# Patient Record
Sex: Male | Born: 1965 | Race: White | Hispanic: No | Marital: Single | Smoking: Current every day smoker
Health system: Southern US, Community
[De-identification: ages and names within clinical notes are randomized; demographics above are authoritative.]

## PROBLEM LIST (undated history)

## (undated) DIAGNOSIS — E119 Type 2 diabetes mellitus without complications: Secondary | ICD-10-CM

## (undated) DIAGNOSIS — J181 Lobar pneumonia, unspecified organism: Secondary | ICD-10-CM

## (undated) DIAGNOSIS — F1010 Alcohol abuse, uncomplicated: Secondary | ICD-10-CM

## (undated) DIAGNOSIS — I509 Heart failure, unspecified: Secondary | ICD-10-CM

## (undated) DIAGNOSIS — N289 Disorder of kidney and ureter, unspecified: Secondary | ICD-10-CM

## (undated) HISTORY — PX: KNEE SURGERY: SHX244

## (undated) HISTORY — PX: OTHER SURGICAL HISTORY: SHX169

---

## 2011-03-26 ENCOUNTER — Emergency Department (HOSPITAL_COMMUNITY)
Admission: EM | Admit: 2011-03-26 | Discharge: 2011-03-26 | Disposition: A | Discharge status: Rehabilitation Facility | Payer: Self-pay | Attending: Emergency Medicine | Admitting: Emergency Medicine

## 2011-03-26 ENCOUNTER — Emergency Department (HOSPITAL_COMMUNITY): Payer: Self-pay

## 2011-03-26 ENCOUNTER — Other Ambulatory Visit: Payer: Self-pay

## 2011-03-26 ENCOUNTER — Encounter: Payer: Self-pay | Admitting: Nurse Practitioner

## 2011-03-26 DIAGNOSIS — F32A Depression, unspecified: Secondary | ICD-10-CM

## 2011-03-26 DIAGNOSIS — R109 Unspecified abdominal pain: Secondary | ICD-10-CM | POA: Insufficient documentation

## 2011-03-26 DIAGNOSIS — F17200 Nicotine dependence, unspecified, uncomplicated: Secondary | ICD-10-CM | POA: Insufficient documentation

## 2011-03-26 DIAGNOSIS — R079 Chest pain, unspecified: Secondary | ICD-10-CM | POA: Insufficient documentation

## 2011-03-26 DIAGNOSIS — F329 Major depressive disorder, single episode, unspecified: Secondary | ICD-10-CM

## 2011-03-26 DIAGNOSIS — R05 Cough: Secondary | ICD-10-CM

## 2011-03-26 DIAGNOSIS — R059 Cough, unspecified: Secondary | ICD-10-CM

## 2011-03-26 DIAGNOSIS — F1020 Alcohol dependence, uncomplicated: Secondary | ICD-10-CM

## 2011-03-26 DIAGNOSIS — R0602 Shortness of breath: Secondary | ICD-10-CM | POA: Insufficient documentation

## 2011-03-26 DIAGNOSIS — F3289 Other specified depressive episodes: Secondary | ICD-10-CM | POA: Insufficient documentation

## 2011-03-26 LAB — RAPID URINE DRUG SCREEN, HOSP PERFORMED
Amphetamines: NOT DETECTED
Barbiturates: NOT DETECTED
Benzodiazepines: NOT DETECTED
Cocaine: NOT DETECTED
Opiates: NOT DETECTED
Tetrahydrocannabinol: NOT DETECTED

## 2011-03-26 LAB — URINE MICROSCOPIC-ADD ON

## 2011-03-26 LAB — BASIC METABOLIC PANEL WITH GFR
CO2: 26 meq/L (ref 19–32)
Chloride: 100 meq/L (ref 96–112)
Creatinine, Ser: 0.85 mg/dL (ref 0.50–1.35)
GFR calc Af Amer: >90 mL/min (ref >90)
Potassium: 4 meq/L (ref 3.5–5.1)
Sodium: 135 meq/L (ref 135–145)

## 2011-03-26 LAB — URINALYSIS, ROUTINE W REFLEX MICROSCOPIC
Bilirubin Urine: NEGATIVE
Glucose, UA: NEGATIVE mg/dL
Hgb urine dipstick: NEGATIVE
Ketones, ur: NEGATIVE mg/dL
Nitrite: NEGATIVE
Protein, ur: NEGATIVE mg/dL
Specific Gravity, Urine: 1.022 (ref 1.005–1.030)
Urobilinogen, UA: 1 mg/dL (ref 0.0–1.0)
pH: 6 (ref 5.0–8.0)

## 2011-03-26 LAB — POCT I-STAT TROPONIN I: Troponin i, poc: 0 ng/mL (ref 0.00–0.08)

## 2011-03-26 LAB — CBC
HCT: 45.8 % (ref 39.0–52.0)
Hemoglobin: 16.1 g/dL (ref 13.0–17.0)
MCH: 32.1 pg (ref 26.0–34.0)
MCHC: 35.2 g/dL (ref 30.0–36.0)
MCV: 91.2 fL (ref 78.0–100.0)
Platelets: 214 K/uL (ref 150–400)
RBC: 5.02 MIL/uL (ref 4.22–5.81)
RDW: 13.6 % (ref 11.5–15.5)
WBC: 8.5 K/uL (ref 4.0–10.5)

## 2011-03-26 LAB — BASIC METABOLIC PANEL
BUN: 12 mg/dL (ref 6–23)
Calcium: 9.5 mg/dL (ref 8.4–10.5)
GFR calc non Af Amer: >90 mL/min (ref >90)
Glucose, Bld: 107 mg/dL — ABNORMAL HIGH (ref 70–99)

## 2011-03-26 LAB — ETHANOL: Alcohol, Ethyl (B): <11 mg/dL (ref 0–11)

## 2011-03-26 MED ORDER — GI COCKTAIL ~~LOC~~
30.0000 mL | Freq: Once | ORAL | Status: AC
  Administered 2011-03-26: 30 mL via ORAL
  Filled 2011-03-26: qty 30

## 2011-03-26 NOTE — ED Notes (Signed)
Pt reports he needs to go to [facility] for detox fro ETOH, sent here for medical clearance. Reports last drink last night, daily drinker about a 12 pack of beer. Denies other drugs or substances.

## 2011-03-26 NOTE — BH Assessment (Signed)
Assessment Note   Alex Martinez is an 45 y.o. male.  Patient presented to emergency department after self referring to [facility] for help with alcohol treatment for medical clearance. Patient reports long history of depression AEB feelings of helplessness/helplessness, sometimes feeling crying, deterioration of ADL's and loss of appetite with unknown weight loss.   Patient reports depression has always been present resulting from being adopted and not feeling wanted with worsening symptoms over the past 4 years after his mother and grandmother passed away for which he was the primary care giver for. Additionally patient reports anxiety with shortness of breath and irritability feeling trapped at times. Patient has been using alcohol to cope and does not understand how his life has become so unmanageable. Patient continues to suffer from grief and loss from loosing her mother and grandmother and has had legal charges as a result of alcohol use. He is calm and cooperative wanting help to return his life back on track.  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 No pertinent past medical history. Axis IV: economic problems, housing problems, occupational problems, other psychosocial or environmental problems and problems related to legal system/crime Axis V: 41-50 serious symptoms  Past Medical History: History reviewed. No pertinent past medical history.  History reviewed. No pertinent past surgical history.  Family History:  Family History  Problem Relation Age of Onset  . Adopted: Yes  . Cancer Father     Social History:  reports that he has been smoking.  He does not have any smokeless tobacco history on file. He reports that he drinks about 33.6 ounces of alcohol per week. He reports that he does not use illicit drugs.  Additional Social History:  Alcohol / Drug Use Pain Medications: no Prescriptions: no Over the Counter:  no History of alcohol / drug use?: Yes Longest period of sobriety (when/how long):  (years) Negative Consequences of Use:  (Yes- criminal charge) Withdrawal Symptoms: Other (Comment) (Anxiety) Substance #1 Name of Substance 1:  (Alcohol) 1 - Age of First Use: early 51s 1 - Amount (size/oz): unknown (6-12 beers/daily up to a case when he can afford it) 1 - Frequency: daily 1 - Duration: 2 months 1 - Last Use / Amount: 03/25/11 8-9PM Allergies: No Known Allergies  Home Medications:  Medications Prior to Admission  Medication Dose Route Frequency Provider Last Rate Last Dose  . gi cocktail  30 mL Oral Once Saddie Benders. Ghim, MD   30 mL at 03/26/11 1118   No current outpatient prescriptions on file as of 03/26/2011.    OB/GYN Status:  No LMP for male patient.  General Assessment Data Location of Assessment: [facility] ED Living Arrangements: Alone Can pt return to current living arrangement?: Yes Admission Status: Voluntary Is patient capable of signing voluntary admission?: Yes Transfer from: [facility] Referral Source: Self/Family/Friend  Education Status Is patient currently in school?: No Highest grade of school patient has completed: unknown  Risk to self Suicidal Ideation: No Suicidal Intent: No Is patient at risk for suicide?: No Suicidal Plan?: No Access to Means: No What has been your use of drugs/alcohol within the last 12 months?:  ( 6-12 beers daily up to a case ) Previous Attempts/Gestures: No Other Self Harm Risks: No Triggers for Past Attempts:  (None patient is not suicidal) Intentional Self Injurious Behavior: None Family Suicide History: Unknown (Patient was adopted ) Recent stressful life event(s): Loss (Comment) Persecutory voices/beliefs?: No Depression: Yes Depression  Symptoms: Insomnia;Guilt;Feeling worthless/self pity;Feeling angry/irritable Substance abuse history and/or treatment for substance abuse?: Yes Suicide prevention information given to  non-admitted patients: Not applicable  Risk to Others Homicidal Ideation: No Thoughts of Harm to Others: No Current Homicidal Intent: No Current Homicidal Plan: No Access to Homicidal Means: No Identified Victim:  (Patient is not homicidal) History of harm to others?: No Assessment of Violence: None Noted Violent Behavior Description: None Does patient have access to weapons?: No Criminal Charges Pending?: Yes Describe Pending Criminal Charges:  Irven Shelling - 2 [employer] and larceny felony) Does patient have a court date: Yes Court Date: 04/23/11  Psychosis Hallucinations: None noted Delusions: None noted  Mental Status Report Appear/Hygiene: Body odor;Disheveled;Poor hygiene Eye Contact: Good Motor Activity: Unremarkable Speech: Logical/coherent Level of Consciousness: Alert Mood: Depressed Affect: Anxious Anxiety Level: Severe Thought Processes: Coherent Judgement: Unimpaired Orientation: Person;Place;Time;Situation Obsessive Compulsive Thoughts/Behaviors: None  Cognitive Functioning Concentration: Normal Memory: Recent Intact;Remote Intact IQ: Average Insight: Good Impulse Control: Good Appetite: Poor Weight Loss:  (Yes- unknown amount) Weight Gain:  (None) Sleep: Decreased Total Hours of Sleep:  (3hours/night) Vegetative Symptoms: None  Prior Inpatient Therapy Prior Inpatient Therapy: Yes ([facility] in [place]) Prior Therapy Dates:  (2725D/6644 or 1997) Prior Therapy Facilty/Provider(s): None ([facility] [place] [facility]) Reason for Treatment:  (Alcohol Dependence)  Prior Outpatient Therapy Prior Outpatient Therapy: Yes Reason for Treatment:  (As a child)            Values / Beliefs Cultural Requests During Hospitalization: None Spiritual Requests During Hospitalization: None        Additional Information 1:1 In Past 12 Months?: No CIRT Risk: No Elopement Risk: No Does patient have medical clearance?: Yes     Disposition:   Disposition Disposition of Patient: Referred to Patient referred to: [facility]  On Site Evaluation by:   Reviewed with Physician:    Assessed & entered by Shari Heritage. Chrissie Noa MS, LPCA  Bayard Hugger Mike Gip 03/26/2011 12:10 PM

## 2011-03-26 NOTE — ED Provider Notes (Signed)
Note for EKG documentation   Date: 03/26/2011  Rate: 78  Rhythm: normal sinus rhythm  QRS Axis: normal  Intervals: normal  ST/T Wave abnormalities: normal  Conduction Disutrbances:none  Narrative Interpretation: sinus arrhythymia  Old EKG Reviewed: none available     Richarda Blade, MD 03/26/11 [phone]

## 2011-03-26 NOTE — ED Notes (Signed)
Pt transported to and from radiology in wheelchair with tech and tolerated well.

## 2011-03-26 NOTE — ED Notes (Signed)
[facility] team at bedside for assessment.

## 2011-03-26 NOTE — ED Provider Notes (Signed)
History     CSN: [id number]  Arrival date & time 03/26/11  1026   First MD Initiated Contact with Patient 03/26/11 1055      Chief Complaint  Patient presents with  . Medical Clearance    (Consider location/radiation/quality/duration/timing/severity/associated sxs/prior treatment) HPI Comments: Patient reports that he spoke to a nurse at [facility] which is a detox facility this morning. He was told to come here to the emergency department to speak to a case manager. Reports that he drinks 6-12 beers a day when he can afford it. He reports that he has been depressed reports no close family members, no close friends. He has been living with a family friend. The patient smokes 2 packs per day but denies any other drugs. He reports he is healthy otherwise and takes no other medications. He does report that he has had some intermittent chest tightness in his lower chest and upper epigastric region as well as his left upper flank region. This is associated with occasional shortness of breath and coughing. Is not related to eating. It is not today with nausea, vomiting, sweats. He reports no obvious weight loss, but he has had night sweats in the last several weeks. He voices some concern about this do to his biological father supposedly passing away due to lung cancer at a young age. Patient endorses depression although no active plans of suicide.  He has no PCP.    The history is provided by the patient.    History reviewed. No pertinent past medical history.  History reviewed. No pertinent past surgical history.  Family History  Problem Relation Age of Onset  . Adopted: Yes  . Cancer Father     History  Substance Use Topics  . Smoking status: Current Everyday Smoker -- 2.0 packs/day  . Smokeless tobacco: Not on file  . Alcohol Use: 33.6 oz/week    56 Cans of beer per week      Review of Systems  Respiratory: Positive for cough.   Cardiovascular: Positive for chest pain.    Gastrointestinal: Negative for nausea, vomiting, abdominal pain and diarrhea.  Musculoskeletal: Negative for back pain.  Skin: Negative for color change, rash and wound.  Neurological: Negative for headaches.  Psychiatric/Behavioral: Negative for suicidal ideas.  All other systems reviewed and are negative.    Allergies  Review of patient's allergies indicates no known allergies.  Home Medications  No current outpatient prescriptions on file.  BP 150/95  Pulse 89  Temp(Src) 97.8 F (36.6 C) (Oral)  Resp 18  Ht 5' 10" (1.778 m)  Wt 212 lb (96.163 kg)  BMI 30.42 kg/m2  SpO2 94%  Physical Exam  Nursing note and vitals reviewed. Constitutional: He appears well-developed and well-nourished.  HENT:  Head: Normocephalic and atraumatic.  Eyes: Pupils are equal, round, and reactive to light. No scleral icterus.  Neck: Normal range of motion. Neck supple.  Cardiovascular: Normal rate and regular rhythm.   Pulmonary/Chest: Effort normal. No respiratory distress. He has no wheezes.  Abdominal: Soft. There is no tenderness. There is no rebound.  Skin: Skin is warm.  Psychiatric: His speech is normal. He exhibits a depressed mood. He expresses no suicidal ideation. He expresses no suicidal plans and no homicidal plans.    ED Course  Procedures (including critical care time)  Labs Reviewed  URINALYSIS, ROUTINE W REFLEX MICROSCOPIC - Abnormal; Notable for the following:    Leukocytes, UA SMALL (*)    All other components within normal limits  BASIC METABOLIC PANEL - Abnormal; Notable for the following:    Glucose, Bld 107 (*)    All other components within normal limits  URINE MICROSCOPIC-ADD ON - Abnormal; Notable for the following:    Casts WBC CAST (*) GRANULAR CAST   All other components within normal limits  URINE RAPID DRUG SCREEN (HOSP PERFORMED)  CBC  ETHANOL  POCT I-STAT TROPONIN I  I-STAT TROPONIN I   Dg Chest 2 View  03/26/2011  *RADIOLOGY REPORT*  Clinical  Data: Cough and chest pain  CHEST - 2 VIEW  Comparison: None.  Findings: Normal heart size and normal vascularity.  Lungs are clear without infiltrate, effusion, or mass lesion.  IMPRESSION: Negative  Original Report Authenticated By: Truett Perna, M.D.     1. Alcoholism   2. Depression   3. Cough       MDM    I doubt his CP is cardiac.  Not exertional, not associated with diaphoresis, SOB, although he does smoke so warrants ECG and troponin.  Will get other screening labs.  Discuss with [facility] if cardiac work up pans out to be negative.       1:19 PM  Labs are all ok.  CXR is negative per radiologist which I reviewed.  ECG shows no ischemic changes.  Raquel Sarna with [facility] has seen pt and he is accepted at [facility].  Will discharge to [facility].       Saddie Benders. Otisha Spickler, MD 03/26/11 1319

## 2011-11-18 ENCOUNTER — Encounter (HOSPITAL_COMMUNITY): Payer: Self-pay | Admitting: Emergency Medicine

## 2011-11-18 ENCOUNTER — Emergency Department (HOSPITAL_COMMUNITY)
Admission: EM | Admit: 2011-11-18 | Discharge: 2011-11-19 | Disposition: A | Discharge status: Home / Self Care | Payer: Self-pay | Attending: Emergency Medicine | Admitting: Emergency Medicine

## 2011-11-18 DIAGNOSIS — F17200 Nicotine dependence, unspecified, uncomplicated: Secondary | ICD-10-CM | POA: Insufficient documentation

## 2011-11-18 DIAGNOSIS — F1010 Alcohol abuse, uncomplicated: Secondary | ICD-10-CM | POA: Insufficient documentation

## 2011-11-18 LAB — URINALYSIS, ROUTINE W REFLEX MICROSCOPIC
Bilirubin Urine: NEGATIVE
Ketones, ur: NEGATIVE mg/dL
Nitrite: NEGATIVE
Protein, ur: NEGATIVE mg/dL
Specific Gravity, Urine: 1.019 (ref 1.005–1.030)
Urobilinogen, UA: 0.2 mg/dL (ref 0.0–1.0)

## 2011-11-18 LAB — COMPREHENSIVE METABOLIC PANEL
ALT: 59 U/L — ABNORMAL HIGH (ref 0–53)
AST: 86 U/L — ABNORMAL HIGH (ref 0–37)
Alkaline Phosphatase: 89 U/L (ref 39–117)
GFR calc Af Amer: >90 mL/min (ref >90)
Glucose, Bld: 108 mg/dL — ABNORMAL HIGH (ref 70–99)
Potassium: 3.5 mEq/L (ref 3.5–5.1)
Sodium: 134 mEq/L — ABNORMAL LOW (ref 135–145)
Total Protein: 6.6 g/dL (ref 6.0–8.3)

## 2011-11-18 LAB — RAPID URINE DRUG SCREEN, HOSP PERFORMED
Amphetamines: NOT DETECTED
Barbiturates: NOT DETECTED
Benzodiazepines: NOT DETECTED
Cocaine: NOT DETECTED
Tetrahydrocannabinol: NOT DETECTED

## 2011-11-18 LAB — CBC
Hemoglobin: 14.5 g/dL (ref 13.0–17.0)
MCHC: 35.6 g/dL (ref 30.0–36.0)
Platelets: 203 10*3/uL (ref 150–400)
RBC: 4.41 MIL/uL (ref 4.22–5.81)

## 2011-11-18 MED ORDER — ALUM & MAG HYDROXIDE-SIMETH 200-200-20 MG/5ML PO SUSP: 30.0000 mL | ORAL | Status: DC | PRN

## 2011-11-18 MED ORDER — ONDANSETRON HCL 4 MG PO TABS: 4.0000 mg | ORAL_TABLET | Freq: Three times a day (TID) | ORAL | Status: DC | PRN

## 2011-11-18 MED ORDER — NICOTINE 21 MG/24HR TD PT24
21.0000 mg | MEDICATED_PATCH | Freq: Once | TRANSDERMAL | Status: DC
  Administered 2011-11-18: 21 mg via TRANSDERMAL
  Filled 2011-11-18: qty 1

## 2011-11-18 MED ORDER — NICOTINE 21 MG/24HR TD PT24: 21.0000 mg | MEDICATED_PATCH | Freq: Every day | TRANSDERMAL | Status: DC

## 2011-11-18 MED ORDER — LORAZEPAM 1 MG PO TABS: 1.0000 mg | ORAL_TABLET | Freq: Three times a day (TID) | ORAL | Status: DC | PRN

## 2011-11-18 MED ORDER — LORAZEPAM 1 MG PO TABS
1.0000 mg | ORAL_TABLET | Freq: Once | ORAL | Status: AC
Start: 2011-11-18 — End: 2011-11-18
  Administered 2011-11-18: 1 mg via ORAL
  Filled 2011-11-18: qty 1

## 2011-11-18 MED ORDER — ACETAMINOPHEN 325 MG PO TABS: 650.0000 mg | ORAL_TABLET | ORAL | Status: DC | PRN

## 2011-11-18 MED ORDER — IBUPROFEN 600 MG PO TABS
600.0000 mg | ORAL_TABLET | Freq: Three times a day (TID) | ORAL | Status: DC | PRN
  Administered 2011-11-18: 600 mg via ORAL
  Filled 2011-11-18: qty 1

## 2011-11-18 MED ORDER — ZOLPIDEM TARTRATE 5 MG PO TABS
5.0000 mg | ORAL_TABLET | Freq: Every evening | ORAL | Status: DC | PRN
  Administered 2011-11-18: 5 mg via ORAL
  Filled 2011-11-18: qty 1

## 2011-11-18 NOTE — ED Notes (Signed)
Patient changed into blue scrubs with no clothing on underneath. Security called to wand and search pt belongings.

## 2011-11-18 NOTE — ED Notes (Signed)
Pt states he wants detox from alcohol.  Says he sometimes hears voices after his prayers.  Denies SI/HI.  Last alcohol intake was this AM.

## 2011-11-18 NOTE — ED Provider Notes (Signed)
History     CSN: [id number]  Arrival date & time 11/18/11  1708   First MD Initiated Contact with Patient 11/18/11 1752      Chief Complaint  Patient presents with  . Medical Clearance    wants detox off ETOH. Denies SI/HI    (Consider location/radiation/quality/duration/timing/severity/associated sxs/prior treatment) The history is provided by the patient.    46 y.o.male INAD presenting for detox from alcohol. Patient states that he drinks 440 ounce bottles of beer daily for the past year. He did not drink before that time. Patient does have a history of seizure disorder brought on by stroking lites. He states that he has never had a seizure from alcohol withdrawal. He is having monetary issues with his family at this time he denies any suicidal homicidal ideations hallucinations or acute agitation   History reviewed. No pertinent past medical history.  Past Surgical History  Procedure Date  . Knee surgery   . Arm surgery     Family History  Problem Relation Age of Onset  . Adopted: Yes  . Cancer Father     History  Substance Use Topics  . Smoking status: Current Everyday Smoker -- 2.0 packs/day  . Smokeless tobacco: Not on file  . Alcohol Use: 33.6 oz/week    56 Cans of beer per week     daily      Review of Systems  Psychiatric/Behavioral: Negative for suicidal ideas, hallucinations and self-injury.       Alcohol abuse  All other systems reviewed and are negative.    Allergies  Review of patient's allergies indicates no known allergies.  Home Medications  No current outpatient prescriptions on file.  BP 154/102  Pulse 117  Temp 98.7 F (37.1 C) (Oral)  Resp 18  SpO2 97%  Physical Exam  Vitals reviewed. Constitutional: He is oriented to person, place, and time. He appears well-developed and well-nourished. No distress.  HENT:  Head: Normocephalic and atraumatic.       No tongue fasciculations.  Eyes: Conjunctivae and EOM are normal. Pupils  are equal, round, and reactive to light.  Neck: Normal range of motion.  Cardiovascular: Normal rate, normal heart sounds and intact distal pulses.  Exam reveals no gallop and no friction rub.   No murmur heard.      Patient is not tachycardic at time of exam.  Pulmonary/Chest: Effort normal and breath sounds normal. No respiratory distress. He has no wheezes. He has no rales. He exhibits no tenderness.  Abdominal: Bowel sounds are normal.  Musculoskeletal: Normal range of motion.  Neurological: He is alert and oriented to person, place, and time.  Skin: Skin is warm and dry.  Psychiatric: He has a normal mood and affect. His behavior is normal. Judgment and thought content normal.    ED Course  Procedures (including critical care time)  Labs Reviewed  COMPREHENSIVE METABOLIC PANEL - Abnormal; Notable for the following:    Sodium 134 (*)     Chloride 95 (*)     Glucose, Bld 108 (*)     Albumin 3.3 (*)     AST 86 (*)  REPEATED TO VERIFY   ALT 59 (*)     All other components within normal limits  CBC  ETHANOL  ACETAMINOPHEN LEVEL  URINE RAPID DRUG SCREEN (HOSP PERFORMED)  URINALYSIS, ROUTINE W REFLEX MICROSCOPIC   No results found.   1. Alcohol abuse       MDM  46 year old male in no acute distress  presenting for alcohol detoxification. Patient shows no sign of onset of delirium tremens. He has no medical issues at this time. Physical exam is unremarkable and blood work shows only a slight elevation in LFTs.  He is medically cleared for psychiatric evaluation for detox from alcohol.         Monico Blitz, PA-C 11/19/11 [phone]
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
## 2011-11-18 NOTE — ED Notes (Signed)
Pt presenting to ed with c/o needing detox pt states he has been drinking everyday x 1 year. Pt states he had his last drink at 14:00pm pt states he drinks 4 40oz beers daily. Pt denies SI/HI at this time

## 2011-11-18 NOTE — ED Notes (Signed)
Patient was wanded and belongings searched by security.

## 2011-11-19 NOTE — ED Notes (Signed)
Pt states he wants to be discharged.

## 2011-11-19 NOTE — ED Provider Notes (Addendum)
  Physical Exam  BP 130/83  Pulse 95  Temp 97.5 F (36.4 C) (Oral)  Resp 20  SpO2 98%  Physical Exam  ED Course  Procedures  MDM Patient is requesting detox off alcohol. He states he drinks about 3 to 4 40s a day. He states that she's been drinking because because Dr. Jacelyn Grip in [place] who is the executor of a trust has been stealing money from him. He states it is not able to get him off the trust because Dr. Arelia Sneddon will not sign the paperwork. Patient has been reviewed by behavioral health. He will reportedly have to be evaluated again on a bed is available.      Jasper Riling. Alvino Chapel, MD 11/19/11 [phone]  Patient no longer wants to stay for treatment. He is not suicidal  Or homicidal be discharged home.  Jasper Riling. Alvino Chapel, MD 11/19/11 1410

## 2011-11-19 NOTE — BHH Counselor (Signed)
Per RN, pt requesting to be d/c with OPT resources as he no longer wished to remain for placement at [facility]. Provided OPT resources for pt.

## 2011-11-20 NOTE — ED Provider Notes (Signed)
Medical screening examination/treatment/procedure(s) were performed by non-physician practitioner and as supervising physician I was immediately available for consultation/collaboration.  Leota Jacobsen, MD 11/20/11 [phone]

## 2012-08-01 ENCOUNTER — Emergency Department (HOSPITAL_COMMUNITY): Admission: EM | Admit: 2012-08-01 | Discharge: 2012-08-01 | Discharge status: Against Medical Advice | Payer: Self-pay

## 2012-08-01 NOTE — ED Notes (Signed)
Pt called to triage; states he has to go to work and cannot stay; pt states he doesn't need to stay tonight and walked out of ER; attempted to talk with patient and ask him what he needed to be seen for and patient walked out saying he didn't need to talk; seen leaving ER with family

## 2012-08-03 ENCOUNTER — Emergency Department (HOSPITAL_COMMUNITY): Payer: Self-pay

## 2012-08-03 ENCOUNTER — Emergency Department (HOSPITAL_COMMUNITY)
Admission: EM | Admit: 2012-08-03 | Discharge: 2012-08-03 | Disposition: A | Discharge status: Home / Self Care | Payer: Self-pay | Attending: Emergency Medicine | Admitting: Emergency Medicine

## 2012-08-03 ENCOUNTER — Encounter (HOSPITAL_COMMUNITY): Payer: Self-pay | Admitting: Emergency Medicine

## 2012-08-03 DIAGNOSIS — K625 Hemorrhage of anus and rectum: Secondary | ICD-10-CM | POA: Insufficient documentation

## 2012-08-03 DIAGNOSIS — R109 Unspecified abdominal pain: Secondary | ICD-10-CM | POA: Insufficient documentation

## 2012-08-03 DIAGNOSIS — F1010 Alcohol abuse, uncomplicated: Secondary | ICD-10-CM | POA: Insufficient documentation

## 2012-08-03 DIAGNOSIS — K921 Melena: Secondary | ICD-10-CM | POA: Insufficient documentation

## 2012-08-03 DIAGNOSIS — F17200 Nicotine dependence, unspecified, uncomplicated: Secondary | ICD-10-CM | POA: Insufficient documentation

## 2012-08-03 LAB — COMPREHENSIVE METABOLIC PANEL
ALT: 33 U/L (ref 0–53)
AST: 31 U/L (ref 0–37)
Albumin: 3.3 g/dL — ABNORMAL LOW (ref 3.5–5.2)
Alkaline Phosphatase: 74 U/L (ref 39–117)
Chloride: 104 mEq/L (ref 96–112)
Potassium: 3.9 mEq/L (ref 3.5–5.1)
Total Bilirubin: 0.3 mg/dL (ref 0.3–1.2)

## 2012-08-03 LAB — CBC
Platelets: 223 10*3/uL (ref 150–400)
RDW: 13.4 % (ref 11.5–15.5)
WBC: 9 10*3/uL (ref 4.0–10.5)

## 2012-08-03 LAB — PROTIME-INR
INR: 0.84 (ref 0.00–1.49)
Prothrombin Time: 11.5 seconds — ABNORMAL LOW (ref 11.6–15.2)

## 2012-08-03 LAB — TYPE AND SCREEN: Antibody Screen: NEGATIVE

## 2012-08-03 MED ORDER — LORAZEPAM 1 MG PO TABS: 1.0000 mg | ORAL_TABLET | Freq: Four times a day (QID) | ORAL | Status: DC | PRN

## 2012-08-03 MED ORDER — IBUPROFEN 600 MG PO TABS: 600.0000 mg | ORAL_TABLET | Freq: Three times a day (TID) | ORAL | Status: DC | PRN

## 2012-08-03 MED ORDER — LORAZEPAM 1 MG PO TABS: 0.0000 mg | ORAL_TABLET | Freq: Two times a day (BID) | ORAL | Status: DC

## 2012-08-03 MED ORDER — THIAMINE HCL 100 MG/ML IJ SOLN: 100.0000 mg | Freq: Every day | INTRAMUSCULAR | Status: DC

## 2012-08-03 MED ORDER — FOLIC ACID 1 MG PO TABS
1.0000 mg | ORAL_TABLET | Freq: Every day | ORAL | Status: DC
  Administered 2012-08-03: 1 mg via ORAL
  Filled 2012-08-03: qty 1

## 2012-08-03 MED ORDER — ADULT MULTIVITAMIN W/MINERALS CH
1.0000 | ORAL_TABLET | Freq: Every day | ORAL | Status: DC
  Administered 2012-08-03: 1 via ORAL
  Filled 2012-08-03: qty 1

## 2012-08-03 MED ORDER — LORAZEPAM 1 MG PO TABS: 0.0000 mg | ORAL_TABLET | Freq: Four times a day (QID) | ORAL | Status: DC

## 2012-08-03 MED ORDER — VITAMIN B-1 100 MG PO TABS
100.0000 mg | ORAL_TABLET | Freq: Every day | ORAL | Status: DC
  Administered 2012-08-03: 100 mg via ORAL
  Filled 2012-08-03: qty 1

## 2012-08-03 MED ORDER — NICOTINE 21 MG/24HR TD PT24
21.0000 mg | MEDICATED_PATCH | Freq: Every day | TRANSDERMAL | Status: DC
  Administered 2012-08-03: 21 mg via TRANSDERMAL
  Filled 2012-08-03: qty 1

## 2012-08-03 MED ORDER — LORAZEPAM 2 MG/ML IJ SOLN: 1.0000 mg | Freq: Four times a day (QID) | INTRAMUSCULAR | Status: DC | PRN

## 2012-08-03 MED ORDER — ONDANSETRON HCL 4 MG PO TABS: 4.0000 mg | ORAL_TABLET | Freq: Three times a day (TID) | ORAL | Status: DC | PRN

## 2012-08-03 NOTE — ED Notes (Signed)
[id number]> Expected date:<BR> Expected time:<BR> Means of arrival:<BR> Comments:<BR> Hold for room 1

## 2012-08-03 NOTE — ED Notes (Signed)
Pt presents for detox from alcohol.  Admits to drinking 3-4/40 oz beers/day. Denies drug use.  Pt calm & cooperative.

## 2012-08-03 NOTE — ED Provider Notes (Addendum)
History     CSN: [id number]  Arrival date & time 08/03/12  1624   First MD Initiated Contact with Patient 08/03/12 1645      Chief Complaint  Patient presents with  . Medical Clearance  . detox    . Rectal Bleeding    (Consider location/radiation/quality/duration/timing/severity/associated sxs/prior treatment) HPI Comments: Is requesting detox from alcohol. He states he drinks 3-4 40 ounce beers daily. His last drink was yesterday morning. He longest he has gone without a drink is 4 days. To light to quit drinking. Denies any SI or HI. Denies any illicit drug use. Also complains of intermittent rectal bleeding and he's had for the past year. With bowel movements he only has bright red blood on the toilet paper with wiping. He states sometimes his stools are brown sometimes his stools are black. Denies any nausea or vomiting. He said intermittent left-sided abdominal pain since 1989 that comes and goes. Denies any chest pain, shortness of breath, dizziness or lightheadedness.  The history is provided by the patient.    History reviewed. No pertinent past medical history.  Past Surgical History  Procedure Laterality Date  . Knee surgery    . Arm surgery      Family History  Problem Relation Age of Onset  . Adopted: Yes  . Cancer Father     History  Substance Use Topics  . Smoking status: Current Every Day Smoker -- 2.00 packs/day    Types: Cigarettes  . Smokeless tobacco: Not on file  . Alcohol Use: 33.6 oz/week    56 Cans of beer per week     Comment: daily      Review of Systems  Constitutional: Negative for fever, activity change and appetite change.  HENT: Negative for congestion and rhinorrhea.   Respiratory: Negative for cough, chest tightness and shortness of breath.   Cardiovascular: Negative for chest pain.  Gastrointestinal: Positive for abdominal pain, blood in stool and hematochezia. Negative for nausea and vomiting.  Genitourinary: Negative for dysuria  and hematuria.  Musculoskeletal: Negative for back pain.  Skin: Negative for rash.  Neurological: Negative for dizziness, weakness and light-headedness.  Psychiatric/Behavioral: Negative for suicidal ideas.  A complete 10 system review of systems was obtained and all systems are negative except as noted in the HPI and PMH.    Allergies  Review of patient's allergies indicates no known allergies.  Home Medications   Current Outpatient Rx  Name  Route  Sig  Dispense  Refill  . ibuprofen (ADVIL,MOTRIN) 200 MG tablet   Oral   Take 200 mg by mouth every 6 (six) hours as needed for pain.           BP 134/94  Pulse 108  Temp(Src) 98.3 F (36.8 C) (Oral)  Resp 13  SpO2 97%  Physical Exam  Constitutional: He is oriented to person, place, and time. He appears well-developed and well-nourished. No distress.  HENT:  Head: Normocephalic and atraumatic.  Mouth/Throat: Oropharynx is clear and moist. No oropharyngeal exudate.  Eyes: Conjunctivae and EOM are normal. Pupils are equal, round, and reactive to light.  Neck: Normal range of motion. Neck supple.  Cardiovascular: Normal rate, regular rhythm and normal heart sounds.   No murmur heard. Pulmonary/Chest: Effort normal and breath sounds normal. No respiratory distress.  Abdominal: Soft. There is no tenderness. There is no rebound and no guarding.  Genitourinary: Guaiac positive stool.  No external hemorrhoids. Brown stool, no gross blood, trace guaiac positive  Musculoskeletal: Normal  range of motion. He exhibits no edema and no tenderness.  Neurological: He is alert and oriented to person, place, and time. No cranial nerve deficit. He exhibits normal muscle tone. Coordination normal.  Skin: Skin is warm.    ED Course  Procedures (including critical care time)  Labs Reviewed  COMPREHENSIVE METABOLIC PANEL - Abnormal; Notable for the following:    Albumin 3.3 (*)    All other components within normal limits  PROTIME-INR -  Abnormal; Notable for the following:    Prothrombin Time 11.5 (*)    All other components within normal limits  CBC  ETHANOL  TYPE AND SCREEN  ABO/RH   Dg Abd Acute W/chest  08/03/2012  *RADIOLOGY REPORT*  Clinical Data: Left-sided abdominal pain with vomiting, constipation, and diarrhea.  ACUTE ABDOMEN SERIES (ABDOMEN 2 VIEW & CHEST 1 VIEW)  Comparison: Chest x-ray 03/26/2011.  Findings: Normal heart size with clear lung fields.  No bony abnormality.  No free air.  Nonobstructive gas pattern.  Moderate stool burden.  Pelvic phleboliths.  No other abnormal calcifications are identified. There is no free air or bowel wall thickening.  IMPRESSION: No active cardiopulmonary disease.  Nonobstructive bowel gas pattern.   Original Report Authenticated By: Rolla Flatten, M.D.      No diagnosis found.    MDM  Alcohol abuse requesting detox. intermittent rectal bleeding for the better part of one year. Questionable history of melena. No vomiting or hematemesis.  Hemoglobin within normal limits and stable from previous. Patient with no gross blood on rectal exam. Brown stool is trace guaiac positive. He is a hemodynamically stable, mildly tachycardic some of which may be coming from his alcohol withdrawal. His rectal bleeding is an ongoing issue for the better part of a year. It can be further worked up as an outpatient.   He is medically clear for alcohol detox. Alcohol withdrawal orders placed.    Date: 08/03/2012  Rate: 99  Rhythm: normal sinus rhythm  QRS Axis: normal  Intervals: normal  ST/T Wave abnormalities: normal  Conduction Disutrbances:none  Narrative Interpretation:   Old EKG Reviewed: unchanged  ACT team has given patient resources.  He does not meet inpatient criteria.  He denies SI or HI. He would like to go home. Heart rate has improved. No evidence of alcohol withdrawal or active bleeding. BP 145/93  Pulse 89  Temp(Src) 99 F (37.2 C) (Oral)  Resp 18  SpO2  95%    Ezequiel Essex, MD 08/03/12 [place], MD 08/03/12 2302

## 2012-08-03 NOTE — ED Notes (Signed)
Pt presenting to ed with c/o wanting to request detox from alcohol. Pt states he has been drinking 3-4 40oz beers daily sometimes more. Pt states he last drank yesterday morning. Pt denies SI/HI at this time. Pt also with c/o rectal bleeding x 1 year that's intermittently heavy.

## 2012-08-04 NOTE — BH Assessment (Signed)
Assessment Note   Alex Martinez is a 47 y.o. male presenting to [facility] for detox from alcohol.  Pt denies SI/HI/Psych.  Pt consumes 3-4 40's daily, last use was 08/01/12, pt drank 2-40's.  Pt denies any other substance use.  Pt denies any previous detox treatment and is requesting rehab and detox treatment.  Pt.'s stressors: homeless, financial and unemployed for 6 yrs. Pt has no legal issues, denies any issues with seizures/blackouts.  Pt c/o w/d sxs: cold sweats.  Pt.'s alcohol level is <11 and pt was given referrals for rehab services.  This Probation officer discussed disposition with Dr. Wyvonnia Dusky, pt will be d/c'd to pursue rehab.    Axis I: Alcohol Abuse Axis II: Deferred Axis III: History reviewed. No pertinent past medical history. Axis IV: economic problems, housing problems, occupational problems, other psychosocial or environmental problems, problems related to social environment and problems with primary support group Axis V: 51-60 moderate symptoms  Past Medical History: History reviewed. No pertinent past medical history.  Past Surgical History  Procedure Laterality Date  . Knee surgery    . Arm surgery      Family History:  Family History  Problem Relation Age of Onset  . Adopted: Yes  . Cancer Father     Social History:  reports that he has been smoking Cigarettes.  He has been smoking about 2.00 packs per day. He does not have any smokeless tobacco history on file. He reports that he drinks about 33.6 ounces of alcohol per week. He reports that he does not use illicit drugs.  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 Withdrawal Symptoms: Other (Comment) (Cold Sweats )  CIWA: CIWA-Ar BP: 145/93 mmHg Pulse Rate: 89 Nausea and Vomiting: no nausea and no vomiting Tactile Disturbances: none Tremor:  no tremor Auditory Disturbances: not present Paroxysmal Sweats: no sweat visible Visual Disturbances: not present Anxiety: no anxiety, at ease Headache, Fullness in Head: none present Agitation: normal activity Orientation and Clouding of Sensorium: oriented and can do serial additions CIWA-Ar Total: 0 COWS:    Allergies: No Known Allergies  Home Medications:  (Not in a hospital admission)  OB/GYN Status:  No LMP for male patient.  General Assessment Data Location of Assessment: [facility] Living Arrangements: Other (Comment) (Homeless ) Can pt return to current living arrangement?: Yes Admission Status: Voluntary Is patient capable of signing voluntary admission?: Yes Transfer from: [facility] Referral Source: MD  Education Status Is patient currently in school?: No Current Grade: None  Highest grade of school patient has completed: None  Name of school: None  Contact person: None   Risk to self Suicidal Ideation: No Suicidal Intent: No Is patient at risk for suicide?: No Suicidal Plan?: No Access to Means: No What has been your use of drugs/alcohol within the last 12 months?: Abusing: Alcohol  Previous Attempts/Gestures: No How many times?: 0 Other Self Harm Risks: None  Triggers for Past Attempts: None known Intentional Self Injurious Behavior: None Family Suicide History: No 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 , Museum/gallery curator, Employment ) Persecutory voices/beliefs?: No Depression: Yes Depression Symptoms: Loss of interest in usual pleasures Substance abuse history and/or treatment for substance abuse?: Yes Suicide prevention information given to non-admitted patients: Not applicable  Risk to Others Homicidal Ideation: No Thoughts of Harm to Others: No Current Homicidal Intent:  No Current Homicidal Plan: No Access to Homicidal Means: No Identified Victim: None  History of harm to others?: No Assessment of Violence: None  Noted Violent Behavior Description: None  Does patient have access to weapons?: No Criminal Charges Pending?: No Does patient have a court date: No  Psychosis Hallucinations: None noted Delusions: None noted  Mental Status Report Appear/Hygiene: Other (Comment) (Appropriate ) Eye Contact: Good Motor Activity: Unremarkable Speech: Logical/coherent Level of Consciousness: Alert Mood: Sad Affect: Sad Anxiety Level: None Thought Processes: Coherent;Relevant Judgement: Unimpaired Orientation: Person;Place;Time;Situation Obsessive Compulsive Thoughts/Behaviors: None  Cognitive Functioning Concentration: Normal Memory: Recent Intact;Remote Intact IQ: Average Insight: Good Impulse Control: Good Appetite: Good Weight Loss: 0 Weight Gain: 0 Sleep: Decreased Total Hours of Sleep: 4 Vegetative Symptoms: None  ADLScreening [facility]) Patient's cognitive ability adequate to safely complete daily activities?: Yes Patient able to express need for assistance with ADLs?: Yes Independently performs ADLs?: Yes (appropriate for developmental age)  Abuse/Neglect [facility]) Physical Abuse: Denies Verbal Abuse: Denies Sexual Abuse: Denies  Prior Inpatient Therapy Prior Inpatient Therapy: No Prior Therapy Dates: None  Prior Therapy Facilty/Provider(s): None  Reason for Treatment: None   Prior Outpatient Therapy Prior Outpatient Therapy: No Prior Therapy Dates: None  Prior Therapy Facilty/Provider(s): None  Reason for Treatment: None   ADL Screening (condition at time of admission) Patient's cognitive ability adequate to safely complete daily activities?: Yes Patient able to express need for assistance with ADLs?: Yes Independently performs ADLs?: Yes (appropriate for developmental age) Weakness of Legs: None Weakness of Arms/Hands: None  Home Assistive Devices/Equipment Home Assistive Devices/Equipment: None  Therapy Consults (therapy consults require a physician  order) PT Evaluation Needed: No OT Evalulation Needed: No SLP Evaluation Needed: No Abuse/Neglect Assessment (Assessment to be complete while patient is alone) Physical Abuse: Denies Verbal Abuse: Denies Sexual Abuse: Denies Exploitation of patient/patient's resources: Denies Self-Neglect: Denies Values / Beliefs Cultural Requests During Hospitalization: None Spiritual Requests During Hospitalization: None Consults Spiritual Care Consult Needed: No Social Work Consult Needed: No Regulatory affairs officer (For Healthcare) Advance Directive: Patient does not have advance directive;Patient would not like information Pre-existing out of facility DNR order (yellow form or pink MOST form): No Nutrition Screen- [facility] Patient's home diet: Regular Have you recently lost weight without trying?: No Have you been eating poorly because of a decreased appetite?: No Malnutrition Screening Tool Score: 0  Additional Information 1:1 In Past 12 Months?: No CIRT Risk: No Elopement Risk: No Does patient have medical clearance?: Yes     Disposition:  Disposition Initial Assessment Completed for this Encounter: Yes Disposition of Patient: Referred to;Outpatient treatment (Referrals for rehab ) Type of outpatient treatment: Adult Patient referred to: Other (Comment) (Referrals for Rehab )  On Site Evaluation by:   Reviewed with Physician:     Girtha Rm 08/04/2012 12:27 AM

## 2013-07-20 ENCOUNTER — Ambulatory Visit: Payer: Self-pay

## 2013-08-03 ENCOUNTER — Encounter: Payer: Self-pay | Admitting: Internal Medicine

## 2013-08-03 ENCOUNTER — Ambulatory Visit: Payer: No Typology Code available for payment source | Attending: Internal Medicine | Admitting: Internal Medicine

## 2013-08-03 ENCOUNTER — Other Ambulatory Visit: Payer: Self-pay | Admitting: *Deleted

## 2013-08-03 ENCOUNTER — Ambulatory Visit: Payer: No Typology Code available for payment source | Attending: Internal Medicine

## 2013-08-03 ENCOUNTER — Telehealth: Payer: Self-pay | Admitting: *Deleted

## 2013-08-03 VITALS — BP 133/90 | HR 107 | Temp 99.1°F | Resp 16 | Ht 70.75 in | Wt 215.4 lb

## 2013-08-03 DIAGNOSIS — R059 Cough, unspecified: Secondary | ICD-10-CM

## 2013-08-03 DIAGNOSIS — IMO0001 Reserved for inherently not codable concepts without codable children: Secondary | ICD-10-CM

## 2013-08-03 DIAGNOSIS — K029 Dental caries, unspecified: Secondary | ICD-10-CM

## 2013-08-03 DIAGNOSIS — R079 Chest pain, unspecified: Secondary | ICD-10-CM | POA: Insufficient documentation

## 2013-08-03 DIAGNOSIS — R61 Generalized hyperhidrosis: Secondary | ICD-10-CM | POA: Insufficient documentation

## 2013-08-03 DIAGNOSIS — R05 Cough: Secondary | ICD-10-CM

## 2013-08-03 DIAGNOSIS — F17200 Nicotine dependence, unspecified, uncomplicated: Secondary | ICD-10-CM | POA: Insufficient documentation

## 2013-08-03 DIAGNOSIS — R1110 Vomiting, unspecified: Secondary | ICD-10-CM | POA: Insufficient documentation

## 2013-08-03 MED ORDER — AZITHROMYCIN 250 MG PO TABS: ORAL_TABLET | ORAL | Status: DC

## 2013-08-03 NOTE — Progress Notes (Signed)
Pt here to establish care. States he has had a toothache for the last 3 weeks and would like referral for dental care  Referred to social worker for PHQ-9 score of 20 C/O of night sweats, cough and chest pains for 1 year.

## 2013-08-03 NOTE — Telephone Encounter (Signed)
Patient called to verify appointment for 3:30 PM today. Informed patient that he does have an appointment today ([id number]) at 3:30 PM for eligibility. Alverda Skeans, RN

## 2013-08-03 NOTE — Patient Instructions (Signed)
Smoking Cessation Quitting smoking is important to your health and has many advantages. However, it is not always easy to quit since nicotine is a very addictive drug. Often times, people try 3 times or more before being able to quit. This document explains the best ways for you to prepare to quit smoking. Quitting takes hard work and a lot of effort, but you can do it. ADVANTAGES OF QUITTING SMOKING  You will live longer, feel better, and live better.  Your body will feel the impact of quitting smoking almost immediately.  Within 20 minutes, blood pressure decreases. Your pulse returns to its normal level.  After 8 hours, carbon monoxide levels in the blood return to normal. Your oxygen level increases.  After 24 hours, the chance of having a heart attack starts to decrease. Your breath, hair, and body stop smelling like smoke.  After 48 hours, damaged nerve endings begin to recover. Your sense of taste and smell improve.  After 72 hours, the body is virtually free of nicotine. Your bronchial tubes relax and breathing becomes easier.  After 2 to 12 weeks, lungs can hold more air. Exercise becomes easier and circulation improves.  The risk of having a heart attack, stroke, cancer, or lung disease is greatly reduced.  After 1 year, the risk of coronary heart disease is cut in half.  After 5 years, the risk of stroke falls to the same as a nonsmoker.  After 10 years, the risk of lung cancer is cut in half and the risk of other cancers decreases significantly.  After 15 years, the risk of coronary heart disease drops, usually to the level of a nonsmoker.  If you are pregnant, quitting smoking will improve your chances of having a healthy baby.  The people you live with, especially any children, will be healthier.  You will have extra money to spend on things other than cigarettes. QUESTIONS TO THINK ABOUT BEFORE ATTEMPTING TO QUIT You may want to talk about your answers with your  caregiver.  Why do you want to quit?  If you tried to quit in the past, what helped and what did not?  What will be the most difficult situations for you after you quit? How will you plan to handle them?  Who can help you through the tough times? Your family? Friends? A caregiver?  What pleasures do you get from smoking? What ways can you still get pleasure if you quit? Here are some questions to ask your caregiver:  How can you help me to be successful at quitting?  What medicine do you think would be best for me and how should I take it?  What should I do if I need more help?  What is smoking withdrawal like? How can I get information on withdrawal? GET READY  Set a quit date.  Change your environment by getting rid of all cigarettes, ashtrays, matches, and lighters in your home, car, or work. Do not let people smoke in your home.  Review your past attempts to quit. Think about what worked and what did not. GET SUPPORT AND ENCOURAGEMENT You have a better chance of being successful if you have help. You can get support in many ways.  Tell your family, friends, and co-workers that you are going to quit and need their support. Ask them not to smoke around you.  Get individual, group, or telephone counseling and support. Programs are available at local hospitals and health centers. Call your local health department for   information about programs in your area.  Spiritual beliefs and practices may help some smokers quit.  Download a "quit meter" on your computer to keep track of quit statistics, such as how long you have gone without smoking, cigarettes not smoked, and money saved.  Get a self-help book about quitting smoking and staying off of tobacco. Owen yourself from urges to smoke. Talk to someone, go for a walk, or occupy your time with a task.  Change your normal routine. Take a different route to work. Drink tea instead of coffee.  Eat breakfast in a different place.  Reduce your stress. Take a hot bath, exercise, or read a book.  Plan something enjoyable to do every day. Reward yourself for not smoking.  Explore interactive web-based programs that specialize in helping you quit. GET MEDICINE AND USE IT CORRECTLY Medicines can help you stop smoking and decrease the urge to smoke. Combining medicine with the above behavioral methods and support can greatly increase your chances of successfully quitting smoking.  Nicotine replacement therapy helps deliver nicotine to your body without the negative effects and risks of smoking. Nicotine replacement therapy includes nicotine gum, lozenges, inhalers, nasal sprays, and skin patches. Some may be available over-the-counter and others require a prescription.  Antidepressant medicine helps people abstain from smoking, but how this works is unknown. This medicine is available by prescription.  Nicotinic receptor partial agonist medicine simulates the effect of nicotine in your brain. This medicine is available by prescription. Ask your caregiver for advice about which medicines to use and how to use them based on your health history. Your caregiver will tell you what side effects to look out for if you choose to be on a medicine or therapy. Carefully read the information on the package. Do not use any other product containing nicotine while using a nicotine replacement product.  RELAPSE OR DIFFICULT SITUATIONS Most relapses occur within the first 3 months after quitting. Do not be discouraged if you start smoking again. Remember, most people try several times before finally quitting. You may have symptoms of withdrawal because your body is used to nicotine. You may crave cigarettes, be irritable, feel very hungry, cough often, get headaches, or have difficulty concentrating. The withdrawal symptoms are only temporary. They are strongest when you first quit, but they will go away within  10 14 days. To reduce the chances of relapse, try to:  Avoid drinking alcohol. Drinking lowers your chances of successfully quitting.  Reduce the amount of caffeine you consume. Once you quit smoking, the amount of caffeine in your body increases and can give you symptoms, such as a rapid heartbeat, sweating, and anxiety.  Avoid smokers because they can make you want to smoke.  Do not let weight gain distract you. Many smokers will gain weight when they quit, usually less than 10 pounds. Eat a healthy diet and stay active. You can always lose the weight gained after you quit.  Find ways to improve your mood other than smoking. FOR MORE INFORMATION  www.smokefree.gov  Document Released: 03/16/2001 Document Revised: 09/21/2011 Document Reviewed: 07/01/2011 [facility] Patient Information 2014 [place], [place].

## 2013-08-04 LAB — CBC WITH DIFFERENTIAL/PLATELET
BASOS PCT: 1 % (ref 0–1)
Basophils Absolute: 0.1 10*3/uL (ref 0.0–0.1)
Eosinophils Absolute: 0.2 10*3/uL (ref 0.0–0.7)
Eosinophils Relative: 3 % (ref 0–5)
HCT: 41.6 % (ref 39.0–52.0)
HEMOGLOBIN: 14.6 g/dL (ref 13.0–17.0)
Lymphocytes Relative: 36 % (ref 12–46)
Lymphs Abs: 2.8 10*3/uL (ref 0.7–4.0)
MCH: 32.8 pg (ref 26.0–34.0)
MCHC: 35.1 g/dL (ref 30.0–36.0)
MCV: 93.5 fL (ref 78.0–100.0)
MONOS PCT: 5 % (ref 3–12)
Monocytes Absolute: 0.4 10*3/uL (ref 0.1–1.0)
NEUTROS ABS: 4.3 10*3/uL (ref 1.7–7.7)
NEUTROS PCT: 55 % (ref 43–77)
PLATELETS: 377 10*3/uL (ref 150–400)
RBC: 4.45 MIL/uL (ref 4.22–5.81)
RDW: 14.6 % (ref 11.5–15.5)
WBC: 7.9 10*3/uL (ref 4.0–10.5)

## 2013-08-06 NOTE — Progress Notes (Signed)
Patient ID: Alex Martinez, male   DOB: 09-14-1965, 48 y.o.   MRN: [id number]  VOZ:[id number]  [id number]  DOB - 17-Mar-1966  CC:  Chief Complaint  Patient presents with  . Establish Care       HPI: Alex Martinez is a 48 y.o. male with no past medical history, here today to establish medical care. He presents today with complaints of cough with clear sputum production, night sweats, chills, and chest pain for the past year.  Patient reports that he is a smoker but is attempting to quit. Patient reports that his coughing causes him to vomit. Patient also presents with dental pain on bilateral sides of his mouth and wants a dental referral.   Patient has No headache, No chest pain, No abdominal pain - No Nausea, No new weakness tingling or numbness, No Cough - SOB.  No Known Allergies History reviewed. No pertinent past medical history. Current Outpatient Prescriptions on File Prior to Visit  Medication Sig Dispense Refill  . ibuprofen (ADVIL,MOTRIN) 200 MG tablet Take 200 mg by mouth every 6 (six) hours as needed for pain.       No current facility-administered medications on file prior to visit.   Family History  Problem Relation Age of Onset  . Adopted: Yes  . Cancer Father    History   Social History  . Marital Status: Single    Spouse Name: N/A    Number of Children: N/A  . Years of Education: N/A   Occupational History  . Not on file.   Social History Main Topics  . Smoking status: Current Every Day Smoker -- 2.00 packs/day    Types: Cigarettes  . Smokeless tobacco: Not on file  . Alcohol Use: 33.6 oz/week    56 Cans of beer per week     Comment: daily  . Drug Use: No  . Sexual Activity: Not on file   Other Topics Concern  . Not on file   Social History Narrative  . No narrative on file    Review of Systems: Constitutional: Negative for diaphoresis, activity change, appetite change and fatigue. Positive for fever and chills HENT: Negative for ear  pain, nosebleeds, congestion, facial swelling, rhinorrhea, neck pain, neck stiffness and ear discharge. Positive for dental pain Eyes: Negative for pain, discharge, redness, itching and visual disturbance. Respiratory: Negative for choking, chest tightness, shortness of breath, wheezing and stridor. Positive for cough Cardiovascular: Negative for palpitations and leg swelling. Positive for chest pain Gastrointestinal: Negative for abdominal distention. Positive for rectal bleeding due to NSAID use in the past Genitourinary: Negative for dysuria, urgency, frequency, hematuria, flank pain, decreased urine volume, difficulty urinating and dyspareunia.  Musculoskeletal: Negative for back pain, joint swelling, arthralgia and gait problem. Neurological: Negative for dizziness, tremors, seizures, syncope, facial asymmetry, speech difficulty, weakness, light-headedness, numbness and headaches.  Hematological: Negative for adenopathy. Does not bruise/bleed easily. Psychiatric/Behavioral: Negative for hallucinations, behavioral problems, confusion, dysphoric mood, decreased concentration and agitation.     Objective:   Filed Vitals:   08/03/13 1712  BP: 133/90  Pulse: 107  Temp: 99.1 F (37.3 C)  Resp: 16    Physical Exam: Constitutional: Patient appears well-developed and well-nourished. De-shelved appearance. HENT: Normocephalic, atraumatic, External right and left ear normal. Oropharynx is clear and moist.  Eyes: Conjunctivae and EOM are normal. PERRLA, no scleral icterus. Neck: Normal ROM. Neck supple. No JVD. No tracheal deviation. No thyromegaly. CVS: RRR, S1/S2 +, no murmurs, no gallops, no carotid bruit.  Pulmonary:  Effort and breath sounds normal, no stridor, wheezes. Positive for rhonchi throughout Abdominal: Soft. BS +, no distension, tenderness, rebound or guarding.  Musculoskeletal: Normal range of motion. No edema and no tenderness.  Lymphadenopathy: No lymphadenopathy noted,  cervical, inguinal or axillary Neuro: Alert. Normal reflexes, muscle tone coordination. No cranial nerve deficit. Skin: Skin is warm and dry. No rash noted. No erythema. No pallor. patient diaphoretic  Psychiatric: Behavior, judgment, thought content normal. Patient anxious  Lab Results  Component Value Date   WBC 7.9 08/03/2013   HGB 14.6 08/03/2013   HCT 41.6 08/03/2013   MCV 93.5 08/03/2013   PLT 377 08/03/2013   Lab Results  Component Value Date   CREATININE 0.71 08/03/2012   BUN 10 08/03/2012   NA 141 08/03/2012   K 3.9 08/03/2012   CL 104 08/03/2012   CO2 27 08/03/2012    No results found for this basename: HGBA1C   Lipid Panel  No results found for this basename: chol, trig, hdl, cholhdl, vldl, ldlcalc       Assessment and plan:   Alex Martinez was seen today for establish care.  Diagnoses and associated orders for this visit:  Cough - CBC with Differential - DG Chest 2 View; Future - azithromycin (ZITHROMAX) 250 MG tablet; Take two today (500 mg), then take one pill each day after. Smoking Smoking cessation discussed Pain due to dental caries - Ambulatory referral to Dentistry Chest pain - EKG 12-Lead  Patient will need to  Follow up with chest xray to rule out pneumonia.  Patient reports that he may not be able to have chest x-ray performed. Will treat empirically for pneumonia.  The patient was given clear instructions to go to ER or return to medical center if symptoms don't improve, worsen or new problems develop. The patient verbalized understanding. The patient was told to call to get lab results if they haven't heard anything in the next week.     Chari Manning, NP-C [facility] [phone] 08/06/2013, 6:15 PM

## 2013-08-07 ENCOUNTER — Ambulatory Visit: Payer: No Typology Code available for payment source | Attending: Internal Medicine | Admitting: Pharmacist

## 2013-08-07 DIAGNOSIS — Z0000 Encounter for general adult medical examination without abnormal findings: Secondary | ICD-10-CM

## 2013-08-07 NOTE — Progress Notes (Signed)
Patient here for placement of PPD

## 2013-08-09 ENCOUNTER — Ambulatory Visit (HOSPITAL_COMMUNITY)
Admission: RE | Admit: 2013-08-09 | Discharge: 2013-08-09 | Disposition: A | Discharge status: Home / Self Care | Payer: No Typology Code available for payment source | Source: Ambulatory Visit | Attending: Internal Medicine | Admitting: Internal Medicine

## 2013-08-09 ENCOUNTER — Ambulatory Visit: Payer: No Typology Code available for payment source | Attending: Internal Medicine | Admitting: *Deleted

## 2013-08-09 DIAGNOSIS — R05 Cough: Secondary | ICD-10-CM

## 2013-08-09 DIAGNOSIS — R079 Chest pain, unspecified: Secondary | ICD-10-CM | POA: Insufficient documentation

## 2013-08-09 DIAGNOSIS — R059 Cough, unspecified: Secondary | ICD-10-CM

## 2013-08-09 DIAGNOSIS — F17200 Nicotine dependence, unspecified, uncomplicated: Secondary | ICD-10-CM | POA: Insufficient documentation

## 2013-08-09 LAB — TB SKIN TEST
INDURATION: 1 mm
TB SKIN TEST: NEGATIVE

## 2013-08-09 NOTE — Progress Notes (Signed)
Patient here for PPD reading. PPD 0.51m x0.520m. Patient sent for chest x-ray.

## 2013-08-14 ENCOUNTER — Telehealth: Payer: Self-pay | Admitting: *Deleted

## 2013-08-14 NOTE — Telephone Encounter (Signed)
Message copied by Alverda Skeans on Tue Aug 14, 2013  4:53 PM ------      Message from: Chari Manning A      Created: Thu Aug 09, 2013  4:49 PM       Chest xray is normal. Call patient to find out if he is still having cough, fevers? Did he complete the z-pack he was given? ------

## 2013-08-14 NOTE — Telephone Encounter (Signed)
Called to give patient normal chest x-ray results. Patient did not answer. Left a message for patient to return call. Alverda Skeans, RN

## 2013-12-04 ENCOUNTER — Encounter (HOSPITAL_COMMUNITY): Payer: Self-pay | Admitting: Emergency Medicine

## 2013-12-04 ENCOUNTER — Emergency Department (HOSPITAL_COMMUNITY)
Admission: EM | Admit: 2013-12-04 | Discharge: 2013-12-05 | Payer: No Typology Code available for payment source | Attending: Emergency Medicine | Admitting: Emergency Medicine

## 2013-12-04 DIAGNOSIS — Z9289 Personal history of other medical treatment: Secondary | ICD-10-CM

## 2013-12-04 DIAGNOSIS — F39 Unspecified mood [affective] disorder: Secondary | ICD-10-CM | POA: Insufficient documentation

## 2013-12-04 DIAGNOSIS — F1010 Alcohol abuse, uncomplicated: Secondary | ICD-10-CM | POA: Insufficient documentation

## 2013-12-04 DIAGNOSIS — F17200 Nicotine dependence, unspecified, uncomplicated: Secondary | ICD-10-CM | POA: Insufficient documentation

## 2013-12-04 DIAGNOSIS — Z09 Encounter for follow-up examination after completed treatment for conditions other than malignant neoplasm: Secondary | ICD-10-CM

## 2013-12-04 DIAGNOSIS — F411 Generalized anxiety disorder: Secondary | ICD-10-CM | POA: Insufficient documentation

## 2013-12-04 HISTORY — DX: Alcohol abuse, uncomplicated: F10.10

## 2013-12-04 LAB — COMPREHENSIVE METABOLIC PANEL
ALBUMIN: 3.9 g/dL (ref 3.5–5.2)
ALT: 62 U/L — AB (ref 0–53)
AST: 87 U/L — AB (ref 0–37)
Alkaline Phosphatase: 81 U/L (ref 39–117)
Anion gap: 18 — ABNORMAL HIGH (ref 5–15)
BUN: 9 mg/dL (ref 6–23)
CALCIUM: 9.5 mg/dL (ref 8.4–10.5)
CO2: 23 meq/L (ref 19–32)
CREATININE: 0.83 mg/dL (ref 0.50–1.35)
Chloride: 94 mEq/L — ABNORMAL LOW (ref 96–112)
GFR calc Af Amer: >90 mL/min (ref >90)
GFR calc non Af Amer: >90 mL/min (ref >90)
Glucose, Bld: 118 mg/dL — ABNORMAL HIGH (ref 70–99)
Potassium: 4.2 mEq/L (ref 3.7–5.3)
SODIUM: 135 meq/L — AB (ref 137–147)
TOTAL PROTEIN: 7.4 g/dL (ref 6.0–8.3)
Total Bilirubin: 1.2 mg/dL (ref 0.3–1.2)

## 2013-12-04 LAB — RAPID URINE DRUG SCREEN, HOSP PERFORMED
AMPHETAMINES: NOT DETECTED
BENZODIAZEPINES: NOT DETECTED
Barbiturates: NOT DETECTED
Cocaine: NOT DETECTED
OPIATES: NOT DETECTED
Tetrahydrocannabinol: NOT DETECTED

## 2013-12-04 LAB — CBC
HCT: 45.1 % (ref 39.0–52.0)
Hemoglobin: 16 g/dL (ref 13.0–17.0)
MCH: 32.5 pg (ref 26.0–34.0)
MCHC: 35.5 g/dL (ref 30.0–36.0)
MCV: 91.7 fL (ref 78.0–100.0)
PLATELETS: 256 10*3/uL (ref 150–400)
RBC: 4.92 MIL/uL (ref 4.22–5.81)
RDW: 12.8 % (ref 11.5–15.5)
WBC: 7.9 10*3/uL (ref 4.0–10.5)

## 2013-12-04 LAB — SALICYLATE LEVEL

## 2013-12-04 LAB — ACETAMINOPHEN LEVEL: Acetaminophen (Tylenol), Serum: <15 ug/mL (ref 10–30)

## 2013-12-04 LAB — ETHANOL: ALCOHOL ETHYL (B): 57 mg/dL — AB (ref 0–11)

## 2013-12-04 MED ORDER — NICOTINE 21 MG/24HR TD PT24
21.0000 mg | MEDICATED_PATCH | Freq: Every day | TRANSDERMAL | Status: DC
  Administered 2013-12-05: 21 mg via TRANSDERMAL
  Filled 2013-12-04: qty 1

## 2013-12-04 MED ORDER — LORAZEPAM 1 MG PO TABS: 0.0000 mg | ORAL_TABLET | Freq: Two times a day (BID) | ORAL | Status: DC

## 2013-12-04 MED ORDER — LORAZEPAM 1 MG PO TABS
0.0000 mg | ORAL_TABLET | Freq: Four times a day (QID) | ORAL | Status: DC
  Administered 2013-12-04: 2 mg via ORAL
  Administered 2013-12-05 (×3): 1 mg via ORAL
  Filled 2013-12-04: qty 2
  Filled 2013-12-04 (×3): qty 1

## 2013-12-04 NOTE — ED Provider Notes (Signed)
CSN: [id number]     Arrival date & time 12/04/13  1916 History   First MD Initiated Contact with Patient 12/04/13 2237     Chief Complaint  Patient presents with  . Drug / Alcohol Assessment     (Consider location/radiation/quality/duration/timing/severity/associated sxs/prior Treatment) Patient is a 48 y.o. male presenting with drug/alcohol assessment. The history is provided by the patient.  Drug / Alcohol Assessment Associated symptoms: no abdominal pain, no confusion, no headaches, no shortness of breath and no vomiting   pt with hx anxiety, depression, and alcohol abuse, states increased etoh abuse in past few years, daily use. When stops feels very shaky. Denies hx complicated etoh withdrawal, seizures or dts. States he also feels 'not right in head'.  States is very stressed and depressed. 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  Notes multiple stressors, and feelings of worthlessness. Occasional thoughts of suicide, but denies attempt or plan.  Has been on meds for same in past, however noncompliant.      Past Medical History  Diagnosis Date  . Alcohol abuse    Past Surgical History  Procedure Laterality Date  . Knee surgery    . Arm surgery     Family History  Problem Relation Age of Onset  . Adopted: Yes  . Cancer Father    History  Substance Use Topics  . Smoking status: Current Every Day Smoker -- 2.00 packs/day    Types: Cigarettes  . Smokeless tobacco: Not on file  . Alcohol Use: 33.6 oz/week    56 Cans of beer per week     Comment: daily    Review of Systems  Constitutional: Negative for fever and chills.  HENT: Negative for sore throat.   Eyes: Negative for redness.  Respiratory: Negative for shortness of breath.   Cardiovascular: Negative for chest pain.  Gastrointestinal: Negative for vomiting, abdominal pain and diarrhea.  Genitourinary: Negative for flank pain.  Musculoskeletal:  Negative for back pain and neck pain.  Skin: Negative for rash.  Neurological: Negative for headaches.  Hematological: Does not bruise/bleed easily.  Psychiatric/Behavioral: Positive for dysphoric mood. Negative for confusion. The patient is nervous/anxious.       Allergies  Yellow jacket venom  Home Medications   Prior to Admission medications   Not on File   There were no vitals taken for this visit. Physical Exam  Nursing note and vitals reviewed. Constitutional: He is oriented to person, place, and time. He appears well-developed and well-nourished. No distress.  HENT:  Head: Atraumatic.  Mouth/Throat: Oropharynx is clear and moist.  Eyes: Conjunctivae are normal. Pupils are equal, round, and reactive to light. No scleral icterus.  Neck: Normal range of motion. Neck supple. No tracheal deviation present.  Cardiovascular: Normal rate, regular rhythm, normal heart sounds and intact distal pulses.   Pulmonary/Chest: Effort normal and breath sounds normal. No accessory muscle usage. No respiratory distress.  Abdominal: Soft. Bowel sounds are normal. He exhibits no distension. There is no tenderness.  Musculoskeletal: Normal range of motion. He exhibits no edema and no tenderness.  Neurological: He is alert and oriented to person, place, and time.  Steady gait. No tremor or shakes.   Skin: Skin is warm and dry. He is not diaphoretic.  Psychiatric:  Depressed mood.     ED Course  Procedures (including critical care time) Labs Review  Results for orders placed during the hospital encounter of 12/04/13  ACETAMINOPHEN LEVEL  Result Value Ref Range   Acetaminophen (Tylenol), Serum <15.0  10 - 30 ug/mL  CBC      Result Value Ref Range   WBC 7.9  4.0 - 10.5 K/uL   RBC 4.92  4.22 - 5.81 MIL/uL   Hemoglobin 16.0  13.0 - 17.0 g/dL   HCT 45.1  39.0 - 52.0 %   MCV 91.7  78.0 - 100.0 fL   MCH 32.5  26.0 - 34.0 pg   MCHC 35.5  30.0 - 36.0 g/dL   RDW 12.8  11.5 - 15.5 %    Platelets 256  150 - 400 K/uL  COMPREHENSIVE METABOLIC PANEL      Result Value Ref Range   Sodium 135 (*) 137 - 147 mEq/L   Potassium 4.2  3.7 - 5.3 mEq/L   Chloride 94 (*) 96 - 112 mEq/L   CO2 23  19 - 32 mEq/L   Glucose, Bld 118 (*) 70 - 99 mg/dL   BUN 9  6 - 23 mg/dL   Creatinine, Ser 0.83  0.50 - 1.35 mg/dL   Calcium 9.5  8.4 - 10.5 mg/dL   Total Protein 7.4  6.0 - 8.3 g/dL   Albumin 3.9  3.5 - 5.2 g/dL   AST 87 (*) 0 - 37 U/L   ALT 62 (*) 0 - 53 U/L   Alkaline Phosphatase 81  39 - 117 U/L   Total Bilirubin 1.2  0.3 - 1.2 mg/dL   GFR calc non Af Amer >90  >90 mL/min   GFR calc Af Amer >90  >90 mL/min   Anion gap 18 (*) 5 - 15  ETHANOL      Result Value Ref Range   Alcohol, Ethyl (B) 57 (*) 0 - 11 mg/dL  SALICYLATE LEVEL      Result Value Ref Range   Salicylate Lvl <9.0 (*) 2.8 - 20.0 mg/dL      MDM  Labs.  Reviewed nursing notes and prior charts for additional history.   Psych team consulted. Disposition per psych team.  Pt placed on ciwa protocol.  Currently appears medically stable.  No current tremor or shakes.  dispo per psych team.    Mirna Mires, MD 12/04/13 [phone]

## 2013-12-04 NOTE — ED Notes (Signed)
Pt requesting detox from etoh, last drink was 1530 today.  Pt reports RUQ pain.  Pt reports he normally drink 3-4 22oz beers.

## 2013-12-05 ENCOUNTER — Encounter (HOSPITAL_COMMUNITY): Payer: Self-pay | Admitting: Registered Nurse

## 2013-12-05 DIAGNOSIS — Z9289 Personal history of other medical treatment: Secondary | ICD-10-CM

## 2013-12-05 DIAGNOSIS — Z09 Encounter for follow-up examination after completed treatment for conditions other than malignant neoplasm: Secondary | ICD-10-CM

## 2013-12-05 HISTORY — DX: Personal history of other medical treatment: Z92.89

## 2013-12-05 MED ORDER — ACETAMINOPHEN 325 MG PO TABS: 650.0000 mg | ORAL_TABLET | Freq: Four times a day (QID) | ORAL | Status: DC | PRN

## 2013-12-05 MED ORDER — CLONIDINE HCL 0.1 MG PO TABS
0.1000 mg | ORAL_TABLET | Freq: Once | ORAL | Status: AC
  Administered 2013-12-05: 0.1 mg via ORAL
  Filled 2013-12-05: qty 1

## 2013-12-05 MED ORDER — IBUPROFEN 200 MG PO TABS
400.0000 mg | ORAL_TABLET | Freq: Four times a day (QID) | ORAL | Status: DC | PRN
  Administered 2013-12-05 (×2): 400 mg via ORAL
  Filled 2013-12-05 (×2): qty 2

## 2013-12-05 NOTE — Progress Notes (Signed)
[place],   [place] with patient about [employer]. Patient has card with pcp [facility]. Provided pt with pcp contact information and answered questions about program. Will refer patient to [facility] care management for further f/u.

## 2013-12-05 NOTE — BH Assessment (Signed)
[place] Assessment Progress Note Pt accepted to [facility] per Lawson Fiscal, Rn. Pt will be transported by [employer] transportation.  Shuvon Rankin, NP agrees with disposition.

## 2013-12-05 NOTE — ED Notes (Signed)
Patient states he has never been diagnosed with HTN and is prescribed no home meds for such.

## 2013-12-05 NOTE — Consult Note (Signed)
[facility] Face-to-Face Psychiatry Consult   Reason for Consult:  Alcohol detox Referring Physician:  EDP  Alex Martinez is an 48 y.o. male. Total Time spent with patient: 45 minutes  Assessment: AXIS I:  Alcohol Abuse and Substance Induced Mood Disorder AXIS II:  Deferred AXIS III:   Past Medical History  Diagnosis Date  . Alcohol abuse    AXIS IV:  other psychosocial or environmental problems AXIS V:  51-60 moderate symptoms  Plan:  No evidence of imminent risk to self or others at present.   Patient does not meet criteria for psychiatric inpatient admission. Supportive therapy provided about ongoing stressors. Discussed crisis plan, support from social network, calling 911, coming to the Emergency Department, and calling Suicide Hotline. Observation overnight  Subjective:   Alex Martinez is a 48 y.o. male patient admitted with Alcohol Abuse and Substance Induced Mood Disorder.  HPI:  Patient states that he drinks 2-4 forties (beer) a day.  Patient is requesting assistance with detox. Patient denies suicidal/homicidal ideation, psychosis, and paranoia.  Patient denies history of seizures with withdrawal.   HPI Elements:   Location:  alcohol abuse. Quality:  alcohol intoxication. Severity:  withdrawal alcohol. Timing:  2 days.  Past Psychiatric History: Past Medical History  Diagnosis Date  . Alcohol abuse     reports that he has been smoking Cigarettes.  He has been smoking about 2.00 packs per day. He does not have any smokeless tobacco history on file. He reports that he drinks about 33.6 ounces of alcohol per week. He reports that he does not use illicit drugs. Family History  Problem Relation Age of Onset  . Adopted: Yes  . Cancer Father    Family History Substance Abuse: No Family Supports: No Living Arrangements: Alone Can pt return to current living arrangement?: No Abuse/Neglect [facility]) Physical Abuse: Denies Verbal Abuse: Denies Sexual Abuse:  Denies Allergies:   Allergies  Allergen Reactions  . Yellow Jacket Venom [Bee Venom] Swelling    ACT Assessment Complete:  Yes:    Educational Status    Risk to Self: Risk to self with the past 6 months Suicidal Ideation: No Suicidal Intent: No Is patient at risk for suicide?: No Suicidal Plan?: No Access to Means: No What has been your use of drugs/alcohol within the last 12 months?: Abusing:alcohol  Previous Attempts/Gestures: No How many times?: 0 Other Self Harm Risks: None  Triggers for Past Attempts: None known Intentional Self Injurious Behavior: None Family Suicide History: No Recent stressful life event(s): Financial Problems Persecutory voices/beliefs?: No Depression: Yes Depression Symptoms: Loss of interest in usual pleasures Substance abuse history and/or treatment for substance abuse?: Yes Suicide prevention information given to non-admitted patients: Not applicable  Risk to Others: Risk to Others within the past 6 months Homicidal Ideation: No Thoughts of Harm to Others: No Current Homicidal Intent: No Current Homicidal Plan: No Access to Homicidal Means: No Identified Victim: None  History of harm to others?: No Assessment of Violence: None Noted Violent Behavior Description: None  Does patient have access to weapons?: No Criminal Charges Pending?: No Does patient have a court date: No  Abuse: Abuse/Neglect Assessment (Assessment to be complete while patient is alone) Physical Abuse: Denies Verbal Abuse: Denies Sexual Abuse: Denies Exploitation of patient/patient's resources: Denies Self-Neglect: Denies  Prior Inpatient Therapy: Prior Inpatient Therapy Prior Inpatient Therapy: Yes Prior Therapy Dates: 2012,90's Prior Therapy Facilty/Provider(s): [facility], [facility], [facility]  Reason for Treatment: Detox/Rehab   Prior Outpatient Therapy: Prior Outpatient  Therapy Prior Outpatient Therapy: No Prior Therapy Dates: None  Prior Therapy  Facilty/Provider(s): None  Reason for Treatment: None   Additional Information: Additional Information 1:1 In Past 12 Months?: No CIRT Risk: No Elopement Risk: No Does patient have medical clearance?: Yes                  Objective: Blood pressure 148/105, pulse 122, temperature 97.7 F (36.5 C), temperature source Oral, resp. rate 18, SpO2 97.00%.There is no weight on file to calculate BMI. Results for orders placed during the hospital encounter of 12/04/13 (from the past 72 hour(s))  ACETAMINOPHEN LEVEL     Status: None   Collection Time    12/04/13  8:34 PM      Result Value Ref Range   Acetaminophen (Tylenol), Serum <15.0  10 - 30 ug/mL   Comment:            THERAPEUTIC CONCENTRATIONS VARY     SIGNIFICANTLY. A RANGE OF 10-30     ug/mL MAY BE AN EFFECTIVE     CONCENTRATION FOR MANY PATIENTS.     HOWEVER, SOME ARE BEST TREATED     AT CONCENTRATIONS OUTSIDE THIS     RANGE.     ACETAMINOPHEN CONCENTRATIONS     >150 ug/mL AT 4 HOURS AFTER     INGESTION AND >50 ug/mL AT 12     HOURS AFTER INGESTION ARE     OFTEN ASSOCIATED WITH TOXIC     REACTIONS.  CBC     Status: None   Collection Time    12/04/13  8:34 PM      Result Value Ref Range   WBC 7.9  4.0 - 10.5 K/uL   RBC 4.92  4.22 - 5.81 MIL/uL   Hemoglobin 16.0  13.0 - 17.0 g/dL   HCT 45.1  39.0 - 52.0 %   MCV 91.7  78.0 - 100.0 fL   MCH 32.5  26.0 - 34.0 pg   MCHC 35.5  30.0 - 36.0 g/dL   RDW 12.8  11.5 - 15.5 %   Platelets 256  150 - 400 K/uL  COMPREHENSIVE METABOLIC PANEL     Status: Abnormal   Collection Time    12/04/13  8:34 PM      Result Value Ref Range   Sodium 135 (*) 137 - 147 mEq/L   Potassium 4.2  3.7 - 5.3 mEq/L   Chloride 94 (*) 96 - 112 mEq/L   CO2 23  19 - 32 mEq/L   Glucose, Bld 118 (*) 70 - 99 mg/dL   BUN 9  6 - 23 mg/dL   Creatinine, Ser 0.83  0.50 - 1.35 mg/dL   Calcium 9.5  8.4 - 10.5 mg/dL   Total Protein 7.4  6.0 - 8.3 g/dL   Albumin 3.9  3.5 - 5.2 g/dL   AST 87 (*) 0 - 37  U/L   ALT 62 (*) 0 - 53 U/L   Alkaline Phosphatase 81  39 - 117 U/L   Total Bilirubin 1.2  0.3 - 1.2 mg/dL   GFR calc non Af Amer >90  >90 mL/min   GFR calc Af Amer >90  >90 mL/min   Comment: (NOTE)     The eGFR has been calculated using the CKD EPI equation.     This calculation has not been validated in all clinical situations.     eGFR's persistently <90 mL/min signify possible Chronic Kidney     Disease.   Anion gap  18 (*) 5 - 15  ETHANOL     Status: Abnormal   Collection Time    12/04/13  8:34 PM      Result Value Ref Range   Alcohol, Ethyl (B) 57 (*) 0 - 11 mg/dL   Comment:            LOWEST DETECTABLE LIMIT FOR     SERUM ALCOHOL IS 11 mg/dL     FOR MEDICAL PURPOSES ONLY  SALICYLATE LEVEL     Status: Abnormal   Collection Time    12/04/13  8:34 PM      Result Value Ref Range   Salicylate Lvl <4.4 (*) 2.8 - 20.0 mg/dL  URINE RAPID DRUG SCREEN (HOSP PERFORMED)     Status: None   Collection Time    12/04/13 11:36 PM      Result Value Ref Range   Opiates NONE DETECTED  NONE DETECTED   Cocaine NONE DETECTED  NONE DETECTED   Benzodiazepines NONE DETECTED  NONE DETECTED   Amphetamines NONE DETECTED  NONE DETECTED   Tetrahydrocannabinol NONE DETECTED  NONE DETECTED   Barbiturates NONE DETECTED  NONE DETECTED   Comment:            DRUG SCREEN FOR MEDICAL PURPOSES     ONLY.  IF CONFIRMATION IS NEEDED     FOR ANY PURPOSE, NOTIFY LAB     WITHIN 5 DAYS.                LOWEST DETECTABLE LIMITS     FOR URINE DRUG SCREEN     Drug Class       Cutoff (ng/mL)     Amphetamine      1000     Barbiturate      200     Benzodiazepine   458     Tricyclics       483     Opiates          300     Cocaine          300     THC              50   Labs are reviewed and are pertinent for ETOH.  Medications reviewed and no changes made.  Current Facility-Administered Medications  Medication Dose Route Frequency Provider Last Rate Last Dose  . acetaminophen (TYLENOL) tablet 650 mg  650 mg  Oral Q6H PRN Mirna Mires, MD      . ibuprofen (ADVIL,MOTRIN) tablet 400 mg  400 mg Oral Q6H PRN Mirna Mires, MD   400 mg at 12/05/13 5075  . LORazepam (ATIVAN) tablet 0-4 mg  0-4 mg Oral 4 times per day Mirna Mires, MD   1 mg at 12/05/13 1101   Followed by  . [START ON 12/07/2013] LORazepam (ATIVAN) tablet 0-4 mg  0-4 mg Oral Q12H Mirna Mires, MD      . nicotine (NICODERM CQ - dosed in mg/24 hours) patch 21 mg  21 mg Transdermal Daily Mirna Mires, MD   21 mg at 12/05/13 1101   No current outpatient prescriptions on file.    Psychiatric Specialty Exam:     Blood pressure 148/105, pulse 122, temperature 97.7 F (36.5 C), temperature source Oral, resp. rate 18, SpO2 97.00%.There is no weight on file to calculate BMI.  General Appearance: Casual and Disheveled  Eye Contact::  Good  Speech:  Clear and Coherent and Normal Rate  Volume:  Normal  Mood:  Anxious  Affect:  Congruent  Thought Process:  Circumstantial and Goal Directed  Orientation:  Full (Time, Place, and Person)  Thought Content:  Rumination  Suicidal Thoughts:  No  Homicidal Thoughts:  No  Memory:  Immediate;   Good Recent;   Good Remote;   Good  Judgement:  Fair  Insight:  Fair  Psychomotor Activity:  Tremor  Concentration:  Fair  Recall:  Good  Fund of Knowledge:Good  Language: Good  Akathisia:  No  Handed:  Right  AIMS (if indicated):     Assets:  Communication Skills Desire for Improvement  Sleep:      Musculoskeletal: Strength & Muscle Tone: within normal limits Gait & Station: normal Patient leans: N/A  Treatment Plan Summary: Inpatient detox.  Patient was accepted to [facility]   Rankin, Shuvon, FNP-BC 12/05/2013 2:34 PM

## 2013-12-05 NOTE — ED Notes (Signed)
Report BP to [facility].

## 2013-12-05 NOTE — BH Assessment (Signed)
Tele Assessment Note   Alex Martinez is a 48 y.o. male who voluntarily presents to [facility] for alcohol detox.  Pt denies SI/HI/AVH. Pt says he went to [facility] for an interview for inpt rehab and was advised to seek detox before he could enter a rehab program.  Pt says he drinks 2-4 40's, daily. His last drink was 12/04/13 and drank 4-40's.  Pt denies any current w/d sxs, however when he is withdrawing, he experiences tremors, sweats and substance-induced halluc.  Pt denies seizures but states he does have blackouts.  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   Axis I: Depressive Disorder NOS and Alcohol Dependence  Axis II: Deferred Axis III:  Past Medical History  Diagnosis Date  . Alcohol abuse    Axis IV: economic problems, occupational problems, other psychosocial or environmental problems, problems related to social environment and problems with primary support group Axis V: 41-50 serious symptoms  Past Medical History:  Past Medical History  Diagnosis Date  . Alcohol abuse     Past Surgical History  Procedure Laterality Date  . Knee surgery    . Arm surgery      Family History:  Family History  Problem Relation Age of Onset  . Adopted: Yes  . Cancer Father     Social History:  reports that he has been smoking Cigarettes.  He has been smoking about 2.00 packs per day. He does not have any smokeless tobacco history on file. He reports that he drinks about 33.6 ounces of alcohol per week. He reports that he does not use illicit drugs.  Additional Social History:  Alcohol / Drug Use Pain Medications: None  Prescriptions: None  Over the Counter: None  History of alcohol / drug use?: Yes Longest period of sobriety (when/how long): Only when in detox  Negative Consequences of Use: Work / School;Personal relationships;Financial Withdrawal Symptoms: Other (Comment) (No current w/d sxs  ) Substance #1 Name of Substance 1: Alcohol  1 - Age of First Use: Teens  1 - Amount (size/oz): 2-40 40's  1 - Frequency: Daily  1 - Duration: On-going  1 - Last Use / Amount: 12/04/13  CIWA: CIWA-Ar BP: 149/110 mmHg Pulse Rate: 116 Nausea and Vomiting: no nausea and no vomiting Tactile Disturbances: none Tremor: no tremor Auditory Disturbances: not present Paroxysmal Sweats: three Visual Disturbances: not present Anxiety: two Headache, Fullness in Head: none present Agitation: normal activity Orientation and Clouding of Sensorium: oriented and can do serial additions CIWA-Ar Total: 5 COWS:    PATIENT STRENGTHS: (choose at least two) Communication skills Motivation for treatment/growth  Allergies:  Allergies  Allergen Reactions  . Yellow Jacket Venom [Bee Venom] Swelling    Home Medications:  (Not in a hospital admission)  OB/GYN Status:  No LMP for male patient.  General Assessment Data [facility] of Assessment: [facility] Is this a Tele or Face-to-Face Assessment?: Face-to-Face Is this an Initial Assessment or a Re-assessment for this encounter?: Initial Assessment Living Arrangements: Alone Can pt return to current living arrangement?: No Admission Status: Voluntary Is patient capable of signing voluntary admission?: Yes Transfer from: [facility] Referral Source: MD  Medical Screening Exam ([place]) Medical Exam completed: No Reason for MSE not completed: Other: (None )  [facility] Crisis Care Plan Living Arrangements: Alone Name of Psychiatrist: None  Name of Therapist: None   Education Status Is patient currently in school?: No  Current Grade: None  Highest grade of school patient has completed: None  Name of school: None  Contact person: None   Risk to self with the past 6 months Suicidal Ideation: No Suicidal Intent: No Is patient at risk for suicide?: No Suicidal Plan?: No Access to Means: No What has been your use of drugs/alcohol within  the last 12 months?: Abusing:alcohol  Previous Attempts/Gestures: No How many times?: 0 Other Self Harm Risks: None  Triggers for Past Attempts: None known Intentional Self Injurious Behavior: None Family Suicide History: No Recent stressful life event(s): Financial Problems Persecutory voices/beliefs?: No Depression: Yes Depression Symptoms: Loss of interest in usual pleasures Substance abuse history and/or treatment for substance abuse?: Yes Suicide prevention information given to non-admitted patients: Not applicable  Risk to Others within the past 6 months Homicidal Ideation: No Thoughts of Harm to Others: No Current Homicidal Intent: No Current Homicidal Plan: No Access to Homicidal Means: No Identified Victim: None  History of harm to others?: No Assessment of Violence: None Noted Violent Behavior Description: None  Does patient have access to weapons?: No Criminal Charges Pending?: No Does patient have a court date: No  Psychosis Hallucinations: None noted Delusions: None noted  Mental Status Report Appear/Hygiene: Disheveled;In scrubs Eye Contact: Good Motor Activity: Unremarkable Speech: Logical/coherent Level of Consciousness: Alert Mood: Sad Affect: Sad Anxiety Level: None Thought Processes: Coherent;Relevant Judgement: Unimpaired Orientation: Person;Place;Time;Situation Obsessive Compulsive Thoughts/Behaviors: None  Cognitive Functioning Concentration: Normal Memory: Recent Intact;Remote Intact IQ: Average Insight: Fair Impulse Control: Fair Appetite: Good Weight Loss: 0 Weight Gain: 0 Sleep: No Change Total Hours of Sleep: 6 Vegetative Symptoms: None  ADLScreening [facility] Assessment Services) Patient's cognitive ability adequate to safely complete daily activities?: Yes Patient able to express need for assistance with ADLs?: Yes Independently performs ADLs?: Yes (appropriate for developmental age)  Prior Inpatient Therapy Prior Inpatient  Therapy: Yes Prior Therapy Dates: 2012,90's Prior Therapy Facilty/Provider(s): [place], [place], [place]  Reason for Treatment: Detox/Rehab   Prior Outpatient Therapy Prior Outpatient Therapy: No Prior Therapy Dates: None  Prior Therapy Facilty/Provider(s): None  Reason for Treatment: None   ADL Screening (condition at time of admission) Patient's cognitive ability adequate to safely complete daily activities?: Yes Is the patient deaf or have difficulty hearing?: No Does the patient have difficulty seeing, even when wearing glasses/contacts?: No Does the patient have difficulty concentrating, remembering, or making decisions?: No Patient able to express need for assistance with ADLs?: Yes Does the patient have difficulty dressing or bathing?: No Independently performs ADLs?: Yes (appropriate for developmental age) Does the patient have difficulty walking or climbing stairs?: No Weakness of Legs: None Weakness of Arms/Hands: None  Home Assistive Devices/Equipment Home Assistive Devices/Equipment: None  Therapy Consults (therapy consults require a physician order) PT Evaluation Needed: No OT Evalulation Needed: No SLP Evaluation Needed: No Abuse/Neglect Assessment (Assessment to be complete while patient is alone) Physical Abuse: Denies Verbal Abuse: Denies Sexual Abuse: Denies Exploitation of patient/patient's resources: Denies Self-Neglect: Denies Values / Beliefs Cultural Requests During Hospitalization: None Spiritual Requests During Hospitalization: None Consults Spiritual Care Consult Needed: No Social Work Consult Needed: No Regulatory affairs officer (For Healthcare) Does patient have an advance directive?: No Would patient like information on creating an advanced directive?: No - patient declined information Nutrition Screen- [facility] Adult/[facility] Patient's home diet: Regular  Additional Information 1:1 In Past 12 Months?: No CIRT Risk: No Elopement Risk: No Does patient  have medical clearance?: Yes     Disposition:  Disposition Initial Assessment Completed  for this Encounter: Yes Disposition of Patient: Inpatient treatment program;Referred to [facility] ) Type of inpatient treatment program: Adult Patient referred to: Other (Comment) ([place] )  Girtha Rm 12/05/2013 7:02 AM

## 2013-12-05 NOTE — ED Notes (Signed)
Patient denies SI, HI, AVH. States anxiety is at 7/10, unable to quantify depression. States he has poor sleep, uses alcohol to "pass out". Reports that he is not taking care of himself anymore; bathing, washing clothes, etc. Reports "I was caregiver for all my family. Something snapped. I have no more patience". States that he met with Peter Congo at [place] last week. Reports ongoing lower back pain/stiffness since 8/29 that occurs on and off.  Encouragement offered. Given Motrin.  Q 15 safety checks in place.

## 2013-12-05 NOTE — ED Notes (Signed)
Update from [place]  that patient is being reviewed by [facility].

## 2013-12-05 NOTE — BH Assessment (Signed)
[place] Assessment Progress Note  Thayer Headings at [facility] wants pt's stabilized , but thinks he should be good to come this afternoon if that is the case.  Louann, Rn, notified and she will follow up on BP and also if he has any medications prescribed for BP.

## 2013-12-11 NOTE — Consult Note (Signed)
Face to face evaluation and I agree with this note

## 2018-04-10 ENCOUNTER — Emergency Department (HOSPITAL_COMMUNITY): Payer: Self-pay

## 2018-04-10 ENCOUNTER — Inpatient Hospital Stay (HOSPITAL_COMMUNITY)
Admission: EM | Admit: 2018-04-10 | Discharge: 2018-04-17 | DRG: 871 | Disposition: A | Discharge status: Home / Self Care | Payer: Self-pay | Attending: Internal Medicine | Admitting: Internal Medicine

## 2018-04-10 ENCOUNTER — Encounter (HOSPITAL_COMMUNITY): Payer: Self-pay | Admitting: *Deleted

## 2018-04-10 DIAGNOSIS — R591 Generalized enlarged lymph nodes: Secondary | ICD-10-CM | POA: Diagnosis present

## 2018-04-10 DIAGNOSIS — E1165 Type 2 diabetes mellitus with hyperglycemia: Secondary | ICD-10-CM | POA: Diagnosis present

## 2018-04-10 DIAGNOSIS — F17200 Nicotine dependence, unspecified, uncomplicated: Secondary | ICD-10-CM | POA: Diagnosis present

## 2018-04-10 DIAGNOSIS — A419 Sepsis, unspecified organism: Principal | ICD-10-CM | POA: Diagnosis present

## 2018-04-10 DIAGNOSIS — E860 Dehydration: Secondary | ICD-10-CM | POA: Diagnosis present

## 2018-04-10 DIAGNOSIS — Z87898 Personal history of other specified conditions: Secondary | ICD-10-CM

## 2018-04-10 DIAGNOSIS — J9601 Acute respiratory failure with hypoxia: Secondary | ICD-10-CM

## 2018-04-10 DIAGNOSIS — I10 Essential (primary) hypertension: Secondary | ICD-10-CM | POA: Diagnosis present

## 2018-04-10 DIAGNOSIS — I152 Hypertension secondary to endocrine disorders: Secondary | ICD-10-CM | POA: Diagnosis present

## 2018-04-10 DIAGNOSIS — J9811 Atelectasis: Secondary | ICD-10-CM | POA: Diagnosis present

## 2018-04-10 DIAGNOSIS — R911 Solitary pulmonary nodule: Secondary | ICD-10-CM

## 2018-04-10 DIAGNOSIS — F1010 Alcohol abuse, uncomplicated: Secondary | ICD-10-CM | POA: Diagnosis present

## 2018-04-10 DIAGNOSIS — F1020 Alcohol dependence, uncomplicated: Secondary | ICD-10-CM | POA: Diagnosis present

## 2018-04-10 DIAGNOSIS — E871 Hypo-osmolality and hyponatremia: Secondary | ICD-10-CM | POA: Diagnosis present

## 2018-04-10 DIAGNOSIS — J440 Chronic obstructive pulmonary disease with acute lower respiratory infection: Secondary | ICD-10-CM | POA: Diagnosis present

## 2018-04-10 DIAGNOSIS — R000 Tachycardia, unspecified: Secondary | ICD-10-CM | POA: Diagnosis present

## 2018-04-10 DIAGNOSIS — Z91030 Bee allergy status: Secondary | ICD-10-CM

## 2018-04-10 DIAGNOSIS — E1159 Type 2 diabetes mellitus with other circulatory complications: Secondary | ICD-10-CM | POA: Diagnosis present

## 2018-04-10 DIAGNOSIS — F17210 Nicotine dependence, cigarettes, uncomplicated: Secondary | ICD-10-CM | POA: Diagnosis present

## 2018-04-10 DIAGNOSIS — J441 Chronic obstructive pulmonary disease with (acute) exacerbation: Secondary | ICD-10-CM | POA: Diagnosis present

## 2018-04-10 DIAGNOSIS — J45909 Unspecified asthma, uncomplicated: Secondary | ICD-10-CM | POA: Diagnosis present

## 2018-04-10 DIAGNOSIS — J9621 Acute and chronic respiratory failure with hypoxia: Secondary | ICD-10-CM | POA: Diagnosis present

## 2018-04-10 DIAGNOSIS — J181 Lobar pneumonia, unspecified organism: Secondary | ICD-10-CM

## 2018-04-10 DIAGNOSIS — R918 Other nonspecific abnormal finding of lung field: Secondary | ICD-10-CM | POA: Diagnosis present

## 2018-04-10 DIAGNOSIS — R739 Hyperglycemia, unspecified: Secondary | ICD-10-CM

## 2018-04-10 DIAGNOSIS — J189 Pneumonia, unspecified organism: Secondary | ICD-10-CM | POA: Diagnosis present

## 2018-04-10 DIAGNOSIS — Z720 Tobacco use: Secondary | ICD-10-CM | POA: Diagnosis present

## 2018-04-10 DIAGNOSIS — R6521 Severe sepsis with septic shock: Secondary | ICD-10-CM | POA: Diagnosis present

## 2018-04-10 HISTORY — DX: Type 2 diabetes mellitus without complications: E11.9

## 2018-04-10 LAB — COMPREHENSIVE METABOLIC PANEL
ALT: 47 U/L — ABNORMAL HIGH (ref 0–44)
AST: 58 U/L — ABNORMAL HIGH (ref 15–41)
Albumin: 3.2 g/dL — ABNORMAL LOW (ref 3.5–5.0)
Alkaline Phosphatase: 118 U/L (ref 38–126)
Anion gap: 16 — ABNORMAL HIGH (ref 5–15)
CO2: 26 mmol/L (ref 22–32)
Calcium: 9.2 mg/dL (ref 8.9–10.3)
Chloride: 85 mmol/L — ABNORMAL LOW (ref 98–111)
Creatinine, Ser: 0.84 mg/dL (ref 0.61–1.24)
GFR calc non Af Amer: >60 mL/min (ref >60)
Glucose, Bld: 306 mg/dL — ABNORMAL HIGH (ref 70–99)
POTASSIUM: 4.3 mmol/L (ref 3.5–5.1)
Sodium: 127 mmol/L — ABNORMAL LOW (ref 135–145)
Total Bilirubin: 0.7 mg/dL (ref 0.3–1.2)
Total Protein: 7.2 g/dL (ref 6.5–8.1)

## 2018-04-10 LAB — CBC WITH DIFFERENTIAL/PLATELET
Abs Immature Granulocytes: 0.02 10*3/uL (ref 0.00–0.07)
Basophils Absolute: 0 10*3/uL (ref 0.0–0.1)
Basophils Relative: 0 %
Eosinophils Absolute: 0.2 10*3/uL (ref 0.0–0.5)
Eosinophils Relative: 2 %
HCT: 51.6 % (ref 39.0–52.0)
Hemoglobin: 17 g/dL (ref 13.0–17.0)
Immature Granulocytes: 0 %
Lymphocytes Relative: 6 %
Lymphs Abs: 0.5 10*3/uL — ABNORMAL LOW (ref 0.7–4.0)
MCH: 32 pg (ref 26.0–34.0)
MCHC: 32.9 g/dL (ref 30.0–36.0)
MCV: 97 fL (ref 80.0–100.0)
MONO ABS: 0.2 10*3/uL (ref 0.1–1.0)
Monocytes Relative: 2 %
Neutro Abs: 7.9 10*3/uL — ABNORMAL HIGH (ref 1.7–7.7)
Neutrophils Relative %: 90 %
Platelets: 239 10*3/uL (ref 150–400)
RBC: 5.32 MIL/uL (ref 4.22–5.81)
RDW: 13 % (ref 11.5–15.5)
WBC: 8.8 10*3/uL (ref 4.0–10.5)
nRBC: 0 % (ref 0.0–0.2)

## 2018-04-10 LAB — I-STAT CG4 LACTIC ACID, ED
LACTIC ACID, VENOUS: 4.47 mmol/L — AB (ref 0.5–1.9)
Lactic Acid, Venous: 2.33 mmol/L (ref 0.5–1.9)

## 2018-04-10 LAB — BRAIN NATRIURETIC PEPTIDE: B Natriuretic Peptide: 191 pg/mL — ABNORMAL HIGH (ref 0.0–100.0)

## 2018-04-10 LAB — INFLUENZA PANEL BY PCR (TYPE A & B)
Influenza A By PCR: NEGATIVE
Influenza B By PCR: NEGATIVE

## 2018-04-10 MED ORDER — SODIUM CHLORIDE 0.9 % IV SOLN
500.0000 mg | INTRAVENOUS | Status: DC
  Administered 2018-04-10 – 2018-04-12 (×3): 500 mg via INTRAVENOUS
  Filled 2018-04-10 (×4): qty 500

## 2018-04-10 MED ORDER — IOPAMIDOL (ISOVUE-370) INJECTION 76%
75.0000 mL | Freq: Once | INTRAVENOUS | Status: AC | PRN
  Administered 2018-04-10: 75 mL via INTRAVENOUS

## 2018-04-10 MED ORDER — IPRATROPIUM BROMIDE 0.02 % IN SOLN
0.5000 mg | Freq: Once | RESPIRATORY_TRACT | Status: AC
  Administered 2018-04-10: 0.5 mg via RESPIRATORY_TRACT
  Filled 2018-04-10: qty 2.5

## 2018-04-10 MED ORDER — ALBUTEROL SULFATE (2.5 MG/3ML) 0.083% IN NEBU
5.0000 mg | INHALATION_SOLUTION | Freq: Once | RESPIRATORY_TRACT | Status: AC
  Administered 2018-04-10: 5 mg via RESPIRATORY_TRACT
  Filled 2018-04-10: qty 6

## 2018-04-10 MED ORDER — SODIUM CHLORIDE 0.9 % IV SOLN
2.0000 g | INTRAVENOUS | Status: DC
  Administered 2018-04-10 – 2018-04-15 (×6): 2 g via INTRAVENOUS
  Filled 2018-04-10 (×6): qty 20

## 2018-04-10 MED ORDER — SODIUM CHLORIDE 0.9 % IV BOLUS (SEPSIS)
1000.0000 mL | Freq: Once | INTRAVENOUS | Status: AC
  Administered 2018-04-10: 1000 mL via INTRAVENOUS

## 2018-04-10 NOTE — ED Notes (Signed)
ED Provider at bedside.

## 2018-04-10 NOTE — ED Notes (Signed)
Pt noted to have both IVs accidentally pulled out while attempting to use a urinal.  Sites cleaned and area tech notified.

## 2018-04-10 NOTE — ED Notes (Signed)
I-Stat Lactic Acid results of 4.47 Reported to [employer], Vicente Males.

## 2018-04-10 NOTE — ED Provider Notes (Signed)
[facility] EMERGENCY DEPARTMENT Provider Note   CSN: [id number] Arrival date & time: 04/10/18  1604     History   Chief Complaint Chief Complaint  Patient presents with  . Pneumonia    HPI Alex Martinez is a 53 y.o. male.  Patient with history of alcohol abuse -- presents with complaint of cough, SOB with exertion x 3 days. Pt has stated similar symptoms in the past with episodes of pneumonia.  Patient has had some pain in his chest that radiates to his back.  No documented fevers reported chills.  No nausea, vomiting, or diarrhea.  Patient does smoke.  No abdominal pain reported.  Questionable positive TB test in the 1990s.  Patient went to his doctor prior to arrival today and was sent to the emergency department for further evaluation.  Patient on oxygen at arrival, not on chronic oxygen.     Past Medical History:  Diagnosis Date  . Alcohol abuse     Patient Active Problem List   Diagnosis Date Noted  . S/P alcohol detoxification 12/05/2013    Past Surgical History:  Procedure Laterality Date  . arm surgery    . KNEE SURGERY          Home Medications    Prior to Admission medications   Not on File    Family History Family History  Adopted: Yes  Problem Relation Age of Onset  . Cancer Father     Social History Social History   Tobacco Use  . Smoking status: Current Every Day Smoker    Packs/day: 2.00    Types: Cigarettes  Substance Use Topics  . Alcohol use: Yes    Alcohol/week: 56.0 standard drinks    Types: 56 Cans of beer per week    Comment: daily  . Drug use: No     Allergies   Yellow jacket venom [bee venom]   Review of Systems Review of Systems  Constitutional: Negative for fever.  HENT: Negative for rhinorrhea and sore throat.   Eyes: Negative for redness.  Respiratory: Positive for cough, shortness of breath and wheezing.   Cardiovascular: Negative for chest pain.  Gastrointestinal: Negative for  abdominal pain, diarrhea, nausea and vomiting.  Genitourinary: Negative for dysuria.  Musculoskeletal: Negative for myalgias.  Skin: Negative for rash.  Neurological: Negative for headaches.     Physical Exam Updated Vital Signs BP (!) 142/77   Pulse (!) 120   Temp 98.8 F (37.1 C) (Oral)   Resp 19   SpO2 96%   Physical Exam Vitals signs and nursing note reviewed.  Constitutional:      Appearance: He is well-developed.  HENT:     Head: Normocephalic and atraumatic.  Eyes:     General:        Right eye: No discharge.        Left eye: No discharge.     Conjunctiva/sclera: Conjunctivae normal.  Neck:     Musculoskeletal: Normal range of motion and neck supple.  Cardiovascular:     Rate and Rhythm: Regular rhythm. Tachycardia present.     Heart sounds: Normal heart sounds.  Pulmonary:     Effort: Pulmonary effort is normal.     Breath sounds: Wheezing present.  Abdominal:     Palpations: Abdomen is soft.     Tenderness: There is no abdominal tenderness. There is no guarding or rebound.  Musculoskeletal:        General: No swelling.  Skin:  General: Skin is warm and dry.  Neurological:     Mental Status: He is alert.      ED Treatments / Results  Labs (all labs ordered are listed, but only abnormal results are displayed) Labs Reviewed  COMPREHENSIVE METABOLIC PANEL - Abnormal; Notable for the following components:      Result Value   Sodium 127 (*)    Chloride 85 (*)    Glucose, Bld 306 (*)    BUN <5 (*)    Albumin 3.2 (*)    AST 58 (*)    ALT 47 (*)    Anion gap 16 (*)    All other components within normal limits  CBC WITH DIFFERENTIAL/PLATELET - Abnormal; Notable for the following components:   Neutro Abs 7.9 (*)    Lymphs Abs 0.5 (*)    All other components within normal limits  BRAIN NATRIURETIC PEPTIDE - Abnormal; Notable for the following components:   B Natriuretic Peptide 191.0 (*)    All other components within normal limits  I-STAT CG4  LACTIC ACID, ED - Abnormal; Notable for the following components:   Lactic Acid, Venous 4.47 (*)    All other components within normal limits  I-STAT CG4 LACTIC ACID, ED - Abnormal; Notable for the following components:   Lactic Acid, Venous 2.33 (*)    All other components within normal limits  CULTURE, BLOOD (ROUTINE X 2)  CULTURE, BLOOD (ROUTINE X 2)  URINALYSIS, ROUTINE W REFLEX MICROSCOPIC  INFLUENZA PANEL BY PCR (TYPE A & B)   ED ECG REPORT   Date: 04/10/2018  Rate: 116  Rhythm: sinus tachycardia  QRS Axis: normal  Intervals: normal  ST/T Wave abnormalities: normal  Conduction Disutrbances:none  Narrative Interpretation:   Old EKG Reviewed: changes noted, faster today  I have personally reviewed the EKG tracing and agree with the computerized printout as noted.  Radiology Dg Chest 2 View  Result Date: 04/10/2018 CLINICAL DATA:  Cough EXAM: CHEST - 2 VIEW COMPARISON:  08/09/2013 FINDINGS: Diffuse interstitial opacity suspect for interstitial inflammatory process versus interstitial edema. Normal heart size. No pleural effusion. No pneumothorax. Slight increased hilar opacity. IMPRESSION: Diffuse interstitial opacity, suspect for interstitial inflammation or edema. No focal consolidation. Increased density within the hilar regions bilaterally, possible nodes. Electronically Signed   By: Donavan Foil M.D.   On: 04/10/2018 17:55    Procedures Procedures (including critical care time)  Medications Ordered in ED Medications  cefTRIAXone (ROCEPHIN) 2 g in sodium chloride 0.9 % 100 mL IVPB (0 g Intravenous Stopped 04/10/18 1849)  azithromycin (ZITHROMAX) 500 mg in sodium chloride 0.9 % 250 mL IVPB (0 mg Intravenous Stopped 04/10/18 2031)  sodium chloride 0.9 % bolus 1,000 mL (1,000 mLs Intravenous New Bag/Given 04/10/18 2036)    And  sodium chloride 0.9 % bolus 1,000 mL (0 mLs Intravenous Stopped 04/10/18 1844)    And  sodium chloride 0.9 % bolus 1,000 mL (0 mLs Intravenous Stopped  04/10/18 2031)  albuterol (PROVENTIL) (2.5 MG/3ML) 0.083% nebulizer solution 5 mg (5 mg Nebulization Given 04/10/18 1915)  ipratropium (ATROVENT) nebulizer solution 0.5 mg (0.5 mg Nebulization Given 04/10/18 1915)  iopamidol (ISOVUE-370) 76 % injection 75 mL (75 mLs Intravenous Contrast Given 04/10/18 1948)     Initial Impression / Assessment and Plan / ED Course  I have reviewed the triage vital signs and the nursing notes.  Pertinent labs & imaging results that were available during my care of the patient were reviewed by me and considered in  my medical decision making (see chart for details).     Patient seen and examined. Work-up initiated. Medications ordered.  Sepsis evaluation and treatment initiated.  Patient with elevated lactate.  30 cc/kg bolus of normal saline ordered.  Patient started on treatment for community-acquired pneumonia.  Vital signs reviewed and are as follows: BP (!) 142/77   Pulse (!) 120   Temp 98.8 F (37.1 C) (Oral)   Resp 19   SpO2 96%   Chest x-ray is abnormal.  Normal white count.  Will further evaluate abnormal chest x-ray with chest CT.  Given tachycardia and hypoxia will rule out PE as well.  Patient will need admission to the hospital.  9:34 PM CT as above. Patient notified of need for continued surveillance of pulmonary nodules.  Sepsis - Repeat Assessment  Performed at:    2100  Vitals     Blood pressure 123/82, pulse (!) 117, temperature 98.8 F (37.1 C), temperature source Oral, resp. rate (!) 23, SpO2 94 %.  Heart:     Tachycardic  Lungs:    Wheezing  Capillary Refill:   <2 sec  Peripheral Pulse:   Radial pulse palpable  Skin:     Normal Color   Spoke with Dr. Marlowe Sax who will see patient.   CRITICAL CARE Performed by: Carlisle Cater PA-C Total critical care time: 40 minutes Critical care time was exclusive of separately billable procedures and treating other patients. Critical care was necessary to treat or prevent imminent or  life-threatening deterioration. Critical care was time spent personally by me on the following activities: development of treatment plan with patient and/or surrogate as well as nursing, discussions with consultants, evaluation of patient's response to treatment, examination of patient, obtaining history from patient or surrogate, ordering and performing treatments and interventions, ordering and review of laboratory studies, ordering and review of radiographic studies, pulse oximetry and re-evaluation of patient's condition.   Final Clinical Impressions(s) / ED Diagnoses   Final diagnoses:  Sepsis with acute organ dysfunction and septic shock, due to unspecified organism, unspecified type ([place])  Pulmonary nodule  Hyperglycemia   Admit.   ED Discharge Orders    None       Suann Larry 04/10/18 2138    Carmin Muskrat, MD 04/11/18 2048

## 2018-04-10 NOTE — ED Notes (Signed)
Pt aware of need for urine sample, urinal at bedside

## 2018-04-10 NOTE — ED Triage Notes (Signed)
Pt sent over by PCP for possible pneumonia, states his shortness of breath and cough started Friday, arrives on 2L [place] with O2 at 92%

## 2018-04-11 ENCOUNTER — Other Ambulatory Visit: Payer: Self-pay

## 2018-04-11 ENCOUNTER — Observation Stay (HOSPITAL_BASED_OUTPATIENT_CLINIC_OR_DEPARTMENT_OTHER): Payer: Self-pay

## 2018-04-11 ENCOUNTER — Encounter (HOSPITAL_COMMUNITY): Payer: Self-pay | Admitting: Emergency Medicine

## 2018-04-11 DIAGNOSIS — Z87898 Personal history of other specified conditions: Secondary | ICD-10-CM

## 2018-04-11 DIAGNOSIS — E871 Hypo-osmolality and hyponatremia: Secondary | ICD-10-CM

## 2018-04-11 DIAGNOSIS — R0609 Other forms of dyspnea: Secondary | ICD-10-CM

## 2018-04-11 DIAGNOSIS — R591 Generalized enlarged lymph nodes: Secondary | ICD-10-CM | POA: Diagnosis present

## 2018-04-11 DIAGNOSIS — J45909 Unspecified asthma, uncomplicated: Secondary | ICD-10-CM | POA: Diagnosis present

## 2018-04-11 DIAGNOSIS — F17200 Nicotine dependence, unspecified, uncomplicated: Secondary | ICD-10-CM | POA: Diagnosis present

## 2018-04-11 DIAGNOSIS — Z720 Tobacco use: Secondary | ICD-10-CM

## 2018-04-11 DIAGNOSIS — J9621 Acute and chronic respiratory failure with hypoxia: Secondary | ICD-10-CM | POA: Diagnosis present

## 2018-04-11 DIAGNOSIS — J9601 Acute respiratory failure with hypoxia: Secondary | ICD-10-CM

## 2018-04-11 DIAGNOSIS — R911 Solitary pulmonary nodule: Secondary | ICD-10-CM

## 2018-04-11 DIAGNOSIS — F1010 Alcohol abuse, uncomplicated: Secondary | ICD-10-CM

## 2018-04-11 DIAGNOSIS — F1020 Alcohol dependence, uncomplicated: Secondary | ICD-10-CM | POA: Diagnosis present

## 2018-04-11 DIAGNOSIS — R918 Other nonspecific abnormal finding of lung field: Secondary | ICD-10-CM | POA: Diagnosis present

## 2018-04-11 HISTORY — DX: Generalized enlarged lymph nodes: R59.1

## 2018-04-11 LAB — HEMOGLOBIN A1C
Hgb A1c MFr Bld: 7.7 % — ABNORMAL HIGH (ref 4.8–5.6)
Mean Plasma Glucose: 174.29 mg/dL

## 2018-04-11 LAB — URINALYSIS, ROUTINE W REFLEX MICROSCOPIC
Bacteria, UA: NONE SEEN
Bilirubin Urine: NEGATIVE
Glucose, UA: 50 mg/dL — AB
Ketones, ur: NEGATIVE mg/dL
Leukocytes, UA: NEGATIVE
Nitrite: NEGATIVE
PH: 6 (ref 5.0–8.0)
Protein, ur: NEGATIVE mg/dL
SPECIFIC GRAVITY, URINE: 1.028 (ref 1.005–1.030)

## 2018-04-11 LAB — BASIC METABOLIC PANEL
Anion gap: 7 (ref 5–15)
BUN: 8 mg/dL (ref 6–20)
CO2: 31 mmol/L (ref 22–32)
Calcium: 8.6 mg/dL — ABNORMAL LOW (ref 8.9–10.3)
Chloride: 99 mmol/L (ref 98–111)
Creatinine, Ser: 0.61 mg/dL (ref 0.61–1.24)
GFR calc Af Amer: >60 mL/min (ref >60)
GFR calc non Af Amer: >60 mL/min (ref >60)
Glucose, Bld: 174 mg/dL — ABNORMAL HIGH (ref 70–99)
POTASSIUM: 4.1 mmol/L (ref 3.5–5.1)
Sodium: 137 mmol/L (ref 135–145)

## 2018-04-11 LAB — GLUCOSE, CAPILLARY
GLUCOSE-CAPILLARY: 194 mg/dL — AB (ref 70–99)
Glucose-Capillary: 199 mg/dL — ABNORMAL HIGH (ref 70–99)
Glucose-Capillary: 233 mg/dL — ABNORMAL HIGH (ref 70–99)
Glucose-Capillary: 240 mg/dL — ABNORMAL HIGH (ref 70–99)
Glucose-Capillary: 266 mg/dL — ABNORMAL HIGH (ref 70–99)

## 2018-04-11 LAB — ECHOCARDIOGRAM COMPLETE

## 2018-04-11 LAB — LACTIC ACID, PLASMA: Lactic Acid, Venous: 1.1 mmol/L (ref 0.5–1.9)

## 2018-04-11 LAB — HIV ANTIBODY (ROUTINE TESTING W REFLEX): HIV SCREEN 4TH GENERATION: NONREACTIVE

## 2018-04-11 MED ORDER — IPRATROPIUM-ALBUTEROL 0.5-2.5 (3) MG/3ML IN SOLN
3.0000 mL | Freq: Four times a day (QID) | RESPIRATORY_TRACT | Status: DC
  Administered 2018-04-11 – 2018-04-13 (×8): 3 mL via RESPIRATORY_TRACT
  Filled 2018-04-11 (×8): qty 3

## 2018-04-11 MED ORDER — METHYLPREDNISOLONE SODIUM SUCC 125 MG IJ SOLR
125.0000 mg | Freq: Once | INTRAMUSCULAR | Status: AC
  Administered 2018-04-11: 125 mg via INTRAVENOUS
  Filled 2018-04-11: qty 2

## 2018-04-11 MED ORDER — ADULT MULTIVITAMIN W/MINERALS CH
1.0000 | ORAL_TABLET | Freq: Every day | ORAL | Status: DC
  Administered 2018-04-11 – 2018-04-17 (×7): 1 via ORAL
  Filled 2018-04-11 (×7): qty 1

## 2018-04-11 MED ORDER — ACETAMINOPHEN 325 MG PO TABS: 650.0000 mg | ORAL_TABLET | Freq: Four times a day (QID) | ORAL | Status: DC | PRN

## 2018-04-11 MED ORDER — THIAMINE HCL 100 MG/ML IJ SOLN
100.0000 mg | Freq: Every day | INTRAMUSCULAR | Status: DC
  Administered 2018-04-15: 100 mg via INTRAVENOUS
  Filled 2018-04-11 (×4): qty 2

## 2018-04-11 MED ORDER — SODIUM CHLORIDE 0.9 % IV SOLN
INTRAVENOUS | Status: DC
  Administered 2018-04-11: 03:00:00 via INTRAVENOUS

## 2018-04-11 MED ORDER — ENOXAPARIN SODIUM 40 MG/0.4ML ~~LOC~~ SOLN
40.0000 mg | Freq: Every day | SUBCUTANEOUS | Status: DC
  Administered 2018-04-11 – 2018-04-13 (×3): 40 mg via SUBCUTANEOUS
  Filled 2018-04-11 (×7): qty 0.4

## 2018-04-11 MED ORDER — ALBUTEROL SULFATE (2.5 MG/3ML) 0.083% IN NEBU
2.5000 mg | INHALATION_SOLUTION | RESPIRATORY_TRACT | Status: DC | PRN
  Filled 2018-04-11: qty 3

## 2018-04-11 MED ORDER — FOLIC ACID 1 MG PO TABS
1.0000 mg | ORAL_TABLET | Freq: Every day | ORAL | Status: DC
  Administered 2018-04-11 – 2018-04-17 (×7): 1 mg via ORAL
  Filled 2018-04-11 (×7): qty 1

## 2018-04-11 MED ORDER — PREDNISONE 20 MG PO TABS: 40.0000 mg | ORAL_TABLET | Freq: Every day | ORAL | Status: DC

## 2018-04-11 MED ORDER — NICOTINE 21 MG/24HR TD PT24
21.0000 mg | MEDICATED_PATCH | Freq: Every day | TRANSDERMAL | Status: DC
  Administered 2018-04-11 – 2018-04-12 (×2): 21 mg via TRANSDERMAL
  Filled 2018-04-11 (×5): qty 1

## 2018-04-11 MED ORDER — LORAZEPAM 1 MG PO TABS: 1.0000 mg | ORAL_TABLET | Freq: Four times a day (QID) | ORAL | Status: AC | PRN

## 2018-04-11 MED ORDER — VITAMIN B-1 100 MG PO TABS
100.0000 mg | ORAL_TABLET | Freq: Every day | ORAL | Status: DC
  Administered 2018-04-11 – 2018-04-17 (×6): 100 mg via ORAL
  Filled 2018-04-11 (×7): qty 1

## 2018-04-11 MED ORDER — SODIUM CHLORIDE 0.9 % IV SOLN: INTRAVENOUS | Status: DC

## 2018-04-11 MED ORDER — DM-GUAIFENESIN ER 30-600 MG PO TB12
2.0000 | ORAL_TABLET | Freq: Two times a day (BID) | ORAL | Status: DC
  Administered 2018-04-11 – 2018-04-17 (×14): 2 via ORAL
  Filled 2018-04-11 (×5): qty 2
  Filled 2018-04-11: qty 1
  Filled 2018-04-11 (×3): qty 2
  Filled 2018-04-11: qty 1
  Filled 2018-04-11 (×3): qty 2
  Filled 2018-04-11: qty 1
  Filled 2018-04-11: qty 2

## 2018-04-11 MED ORDER — PREDNISONE 20 MG PO TABS
40.0000 mg | ORAL_TABLET | Freq: Every day | ORAL | Status: DC
  Administered 2018-04-11 – 2018-04-13 (×3): 40 mg via ORAL
  Filled 2018-04-11 (×3): qty 2

## 2018-04-11 MED ORDER — LORAZEPAM 2 MG/ML IJ SOLN: 1.0000 mg | Freq: Four times a day (QID) | INTRAMUSCULAR | Status: AC | PRN

## 2018-04-11 MED ORDER — INSULIN ASPART 100 UNIT/ML ~~LOC~~ SOLN
0.0000 [IU] | Freq: Three times a day (TID) | SUBCUTANEOUS | Status: DC
  Administered 2018-04-11: 3 [IU] via SUBCUTANEOUS
  Administered 2018-04-11: 2 [IU] via SUBCUTANEOUS
  Administered 2018-04-11: 3 [IU] via SUBCUTANEOUS

## 2018-04-11 MED ORDER — ACETAMINOPHEN 650 MG RE SUPP: 650.0000 mg | Freq: Four times a day (QID) | RECTAL | Status: DC | PRN

## 2018-04-11 NOTE — Progress Notes (Signed)
  Echocardiogram 2D Echocardiogram has been performed.  Alex Martinez 04/11/2018, 1:31 PM

## 2018-04-11 NOTE — H&P (Signed)
History and Physical    Alex Martinez DOB: [birth year] DOA: 04/10/2018  PCP: Leonard Downing, MD  Chief Complaint: Patient sent here from PCPs office for evaluation of possible pneumonia.  HPI: Alex Martinez is a 54 y.o. male with medical history significant of tobacco use, alcohol abuse presenting from his PCPs office for evaluation of possible pneumonia.  He was sent on 2 L oxygen via nasal cannula.   Patient reports having shortness of breath, cough productive of clear sputum, and fatigue for the past 3 to 4 days.  Denies having any fevers or chills.  Denies any history of asthma or COPD.  Reports having decreased p.o. intake.  Denies having any abdominal pain, nausea, or vomiting.  States he was previously told he is a prediabetic. Reports smoking 1 pack of cigarettes per day since age of 33.  Patient did not want to tell me how much alcohol he drinks, stated his last drink was 8 days ago.   Review of Systems: As per HPI otherwise 10 point review of systems negative.  Past Medical History:  Diagnosis Date  . Alcohol abuse     Past Surgical History:  Procedure Laterality Date  . arm surgery    . KNEE SURGERY       reports that he has been smoking cigarettes. He has been smoking about 2.00 packs per day. He does not have any smokeless tobacco history on file. He reports current alcohol use of about 56.0 standard drinks of alcohol per week. He reports that he does not use drugs.  Allergies  Allergen Reactions  . [employer] and Other (See Comments)    Shaking, starts sweating really bad  . Yellow Jacket Venom [Bee Venom] Swelling    Family History  Adopted: Yes  Problem Relation Age of Onset  . Cancer Father     Prior to Admission medications   Not on File    Physical Exam: Vitals:   04/10/18 2315 04/11/18 0000 04/11/18 0015 04/11/18 0109  BP: 108/70 115/68 116/69 127/86  Pulse: (!) 109 99 (!) 108 (!) 108  Resp: (!) 21 19 (!) 21 (!) 21    Temp:    98.9 F (37.2 C)  TempSrc:    Oral  SpO2: 93% 92% 94% 96%    Physical Exam  Constitutional: He is oriented to person, place, and time. He appears well-developed and well-nourished. No distress.  HENT:  Head: Normocephalic.  Mouth/Throat: Oropharynx is clear and moist.  Eyes: Right eye exhibits no discharge. Left eye exhibits no discharge.  Neck: Neck supple.  Cardiovascular: Normal rate, regular rhythm and intact distal pulses.  Pulmonary/Chest: Effort normal. He has wheezes. He has no rales.  Speaking clearly in full sentences Decreased air entry bilaterally Diffuse end expiratory wheezing  Abdominal: Soft. Bowel sounds are normal. He exhibits no distension. There is no abdominal tenderness. There is no guarding.  Musculoskeletal:        General: No edema.  Neurological: He is alert and oriented to person, place, and time.  Skin: Skin is warm and dry. He is not diaphoretic.     Labs on Admission: I have personally reviewed following labs and imaging studies  CBC: Recent Labs  Lab 04/10/18 1619  WBC 8.8  NEUTROABS 7.9*  HGB 17.0  HCT 51.6  MCV 97.0  PLT 656   Basic Metabolic Panel: Recent Labs  Lab 04/10/18 1619  NA 127*  K 4.3  CL 85*  CO2 26  GLUCOSE 306*  BUN <5*  CREATININE 0.84  CALCIUM 9.2   GFR: CrCl cannot be calculated (Unknown ideal weight.). Liver Function Tests: Recent Labs  Lab 04/10/18 1619  AST 58*  ALT 47*  ALKPHOS 118  BILITOT 0.7  PROT 7.2  ALBUMIN 3.2*   No results for input(s): LIPASE, AMYLASE in the last 168 hours. No results for input(s): AMMONIA in the last 168 hours. Coagulation Profile: No results for input(s): INR, PROTIME in the last 168 hours. Cardiac Enzymes: No results for input(s): CKTOTAL, CKMB, CKMBINDEX, TROPONINI in the last 168 hours. BNP (last 3 results) No results for input(s): PROBNP in the last 8760 hours. HbA1C: No results for input(s): HGBA1C in the last 72 hours. CBG: Recent Labs  Lab  04/11/18 0232  GLUCAP 194*   Lipid Profile: No results for input(s): CHOL, HDL, LDLCALC, TRIG, CHOLHDL, LDLDIRECT in the last 72 hours. Thyroid Function Tests: No results for input(s): TSH, T4TOTAL, FREET4, T3FREE, THYROIDAB in the last 72 hours. Anemia Panel: No results for input(s): VITAMINB12, FOLATE, FERRITIN, TIBC, IRON, RETICCTPCT in the last 72 hours. Urine analysis:    Component Value Date/Time   COLORURINE YELLOW 04/10/2018 2325   APPEARANCEUR CLEAR 04/10/2018 2325   LABSPEC 1.028 04/10/2018 2325   PHURINE 6.0 04/10/2018 2325   GLUCOSEU 50 (A) 04/10/2018 2325   HGBUR SMALL (A) 04/10/2018 2325   BILIRUBINUR NEGATIVE 04/10/2018 2325   KETONESUR NEGATIVE 04/10/2018 2325   PROTEINUR NEGATIVE 04/10/2018 2325   UROBILINOGEN 0.2 11/18/2011 1755   NITRITE NEGATIVE 04/10/2018 2325   LEUKOCYTESUR NEGATIVE 04/10/2018 2325    Radiological Exams on Admission: Dg Chest 2 View  Result Date: 04/10/2018 CLINICAL DATA:  Cough EXAM: CHEST - 2 VIEW COMPARISON:  08/09/2013 FINDINGS: Diffuse interstitial opacity suspect for interstitial inflammatory process versus interstitial edema. Normal heart size. No pleural effusion. No pneumothorax. Slight increased hilar opacity. IMPRESSION: Diffuse interstitial opacity, suspect for interstitial inflammation or edema. No focal consolidation. Increased density within the hilar regions bilaterally, possible nodes. Electronically Signed   By: Donavan Foil M.D.   On: 04/10/2018 17:55   Ct Angio Chest Pe W And/or Wo Contrast  Result Date: 04/10/2018 CLINICAL DATA:  Shortness of breath with cough EXAM: CT ANGIOGRAPHY CHEST WITH CONTRAST TECHNIQUE: Multidetector CT imaging of the chest was performed using the standard protocol during bolus administration of intravenous contrast. Multiplanar CT image reconstructions and MIPs were obtained to evaluate the vascular anatomy. CONTRAST:  75 mL ISOVUE-370 IOPAMIDOL (ISOVUE-370) INJECTION 76% COMPARISON:  Chest  radiograph Ju April 10, 2018 FINDINGS: Cardiovascular: There is no demonstrable pulmonary embolus. There is no thoracic aortic aneurysm or dissection. The visualized great vessels appear normal. There are foci of aortic atherosclerosis. There is no pericardial effusion or pericardial thickening. Prominence of the main pulmonary outflow tract is noted with a measured transverse diameter of 3.2 cm. Mediastinum/Nodes: Thyroid appears normal. There are multiple subcentimeter lymph nodes scattered throughout the mediastinum. There is a lymph node in the right hilum measuring 1.8 x 1.4 cm. There is a lymph node in the left hilum measuring 1.4 x 1.1 cm. There is a lymph node anterior to the carina measuring 1.1 x 0.9 cm. No esophageal lesions are evident. Lungs/Pleura: There are small bullae in each upper lobe. There are tree on bud type opacities in the right upper lobe consistent with pneumonia. A focal area of irregular nodular opacity in the posterior segment of the right upper lobe measures 1.7 x 1.2 cm, best seen on axial slice 44  series 8. There is atelectatic change in the right middle lobe. There is a somewhat nodular opacity abutting the pleura in the lateral segment right middle lobe measuring 1.1 x 0.8 cm. Atelectatic changes noted in the posterior left base. No pleural effusion or pleural thickening evident. Upper Abdomen: There is hepatic steatosis. Visualized upper abdominal structures otherwise appear unremarkable. Musculoskeletal: There are no blastic or lytic bone lesions. No chest wall lesions are evident. Review of the MIP images confirms the above findings. IMPRESSION: 1. No demonstrable pulmonary embolus. No thoracic aortic aneurysm or dissection. There are foci of aortic atherosclerosis. 2. Prominence the main pulmonary outflow tract, a finding felt to be indicative of a degree of pulmonary arterial hypertension. 3. There is patchy tree on bud type appearance in the right upper lobe, felt to  represent pneumonia in this area. There are areas of patchy atelectasis bilaterally. 4. There is a somewhat nodular appearing opacity in the posterior segment of the right upper lobe measuring 1.7 x 1.2 cm. A second nodular opacity in the right middle lobe laterally measures 1.1 x 0.8 cm. While these areas may represent foci pneumonia or atelectasis, neoplastic etiology in these areas cannot be excluded. It may be prudent to consider nuclear medicine PET study given these findings. At a minimum, a follow-up chest CT in 4-6 weeks to assess for potential clearing of areas of infiltrate may be advisable. 5. Several prominent lymph nodes noted. Etiology for adenopathy uncertain. Again, nuclear medicine PET study could be helpful to assess for abnormal metabolic activity in these lymph nodes which would heightened suspicion for potential pneumonia. 6.  Hepatic steatosis. Aortic Atherosclerosis (ICD10-I70.0). Electronically Signed   By: Lowella Grip III M.D.   On: 04/10/2018 20:30    EKG: Independently reviewed.  Sinus tachycardia, baseline wander.  Rate increased compared to prior EKG.  Assessment/Plan Principal Problem:   CAP (community acquired pneumonia) Active Problems:   Acute on chronic respiratory failure with hypoxia (HCC)   Reactive airway disease   Pulmonary nodules   Lymphadenopathy   Hyponatremia   History of prediabetes   Hyperglycemia   Alcohol abuse   Tobacco use   Sepsis secondary to community-acquired pneumonia Acute hypoxic respiratory failure secondary to community-acquired pneumonia and acute exacerbation of reactive airway disease Tachycardic and tachypneic.  Afebrile.  Not hypotensive.  Currently on 3 L oxygen via nasal cannula.  No leukocytosis.  Lactic acid 4.47 > 2.33 with IV fluid.  Influenza panel negative.  CTA without evidence of PE.  Showing patchy tree-in-bud type of appearance in the right upper lobe suspicious for pneumonia.  Patient does not have a history of  asthma or COPD documented, however, to have decreased air entry bilaterally and diffuse end expiratory wheezing on exam. -Continue ceftriaxone and azithromycin -Give Solu-Medrol 125 mg once.  Please reassess in the morning to decide whether he needs additional IV steroid or can be transitioned to oral steroid. -Duo nebs every 6 hours -Albuterol nebulizer every 2 hours as needed -Mucinex DM for cough -Continue IV fluid resuscitation -Continue to trend lactate -Blood culture x2 pending -Supplemental oxygen -He will need outpatient PFTs.  Pulmonary nodules CTA showing nodular opacities in the posterior segment of the right upper lobe (1.7 x 1.2 cm) and lateral right middle lobe (1.1 x 0.8 cm).  Per radiologist read, these may represent pneumonia or atelectasis, neoplastic etiology cannot be excluded. -Patient will need repeat CT in 4 to 6 weeks to reassess.  He is high risk in the setting  of longstanding history of cigarette smoking.  Lymphadenopathy CTA with evidence of several prominent lymph nodes. -Patient will need repeat CT or PET scan for further evaluation. He is high risk in the setting of longstanding history of cigarette smoking.  Hyponatremia Corrected sodium 130.  Likely secondary to decreased p.o. intake. -IV fluid -Repeat BMP in a.m.  Hyperglycemia, history of prediabetes Blood glucose 306 on arrival.  Patient reports having a history of prediabetes. -Check A1c -Sliding scale insulin sensitive in setting of steroid use -CBG checks  Alcohol abuse Patient did not want to tell me how much he drinks, states did his last drink was 8 days ago.  No tremors noted on exam. -CIWA monitoring; Ativan PRN -Folate, multivitamin, thiamine  Tobacco use Reports smoking 1 pack of cigarettes per day since age of 53.  I discussed smoking cessation and he appears to be in the pre-contemplative stage at this time. -Nicotine patch  DVT prophylaxis: Lovenox Code Status: Full code.   Discussed with the patient. Family Communication: Male friend at bedside. Disposition Plan: Anticipate discharge in 1 to 2 days. Consults called: None Admission status: Observation, telemetry   Shela Leff MD [facility] Pager [phone]  If 7PM-7AM, please contact night-coverage [email or web address] Password [email or web address]  04/11/2018, 2:57 AM

## 2018-04-11 NOTE — Progress Notes (Addendum)
Progress Note    Alex Martinez  FHL:[id number] DOB: 08-20-1965  DOA: 04/10/2018 PCP: Leonard Downing, MD    Brief Narrative:     Medical records reviewed and are as summarized below:  Alex Martinez is an 53 y.o. male with medical history significant of tobacco use, alcohol abuse presenting from his PCPs office for evaluation of possible pneumonia.  He was sent on 2 L oxygen via nasal cannula.   Assessment/Plan:   Principal Problem:   CAP (community acquired pneumonia) Active Problems:   Acute on chronic respiratory failure with hypoxia (HCC)   Reactive airway disease   Pulmonary nodules   Lymphadenopathy   Hyponatremia   History of prediabetes   Hyperglycemia   Alcohol abuse   Tobacco use  Sepsis secondary to community-acquired pneumonia Acute hypoxic respiratory failure secondary to community-acquired pneumonia - not on O2 at home but now on 3 L oxygen via nasal cannula.   -Influenza panel negative.   -CTA without evidence of PE.  Showing patchy tree-in-bud type of appearance in the right upper lobe suspicious for pneumonia as well as lymphadenopathy and pulmonary nodules that will need close outpatient follow up -Continue ceftriaxone and azithromycin -IV steroids x 1-start PO for now -pulmonary toilet -nebs -Mucinex DM for cough -Continue IV fluid resuscitation -Blood culture x2 pending -Supplemental oxygen -He will need outpatient PFTs and possibly PET to assess nodules  Pulmonary nodules CTA showing nodular opacities in the posterior segment of the right upper lobe (1.7 x 1.2 cm) and lateral right middle lobe (1.1 x 0.8 cm).  Per radiologist read, these may represent pneumonia or atelectasis, neoplastic etiology cannot be excluded. -Patient will need repeat CT in 4 to 6 weeks to reassess.  He is high risk in the setting of longstanding history of cigarette smoking as well as possible PET  Elevated BNP -check echo -? pulm HTN on CT  scan  Lymphadenopathy CTA with evidence of several prominent lymph nodes. -Patient will need repeat CT or PET scan for further evaluation. He is high risk in the setting of longstanding history of cigarette smoking.  Hyponatremia Corrected sodium 130.  Likely secondary to decreased p.o. intake. -resolved  Hyperglycemia, history of prediabetes Blood glucose 306 on arrival.  Patient reports having a history of prediabetes. -A1c: 7.7 -Sliding scale insulin sensitive in setting of steroid use -suspect can be d/c'd on PO mediations   Alcohol abuse clains his last drink was 8 days ago.  No tremors noted on exam. -CIWA monitoring; Ativan PRN -Folate, multivitamin, thiamine  Tobacco use Reports smoking 1 pack of cigarettes per day since age of 32.  -Nicotine patch  Elevated LFTs -from alcohol abuse? -trend  Family Communication/Anticipated D/C date and plan/Code Status   DVT prophylaxis: Lovenox ordered. Code Status: Full Code.  Family Communication: none at bedside Disposition Plan: suspect based on O2 requirements and not much improvement in symptoms overnight that he will need inpatient care   Medical Consultants:    None.   Subjective:   Coughing up white bubbly sputum  Objective:    Vitals:   04/11/18 0015 04/11/18 0109 04/11/18 0328 04/11/18 0610  BP: 116/69 127/86  126/84  Pulse: (!) 108 (!) 108  (!) 105  Resp: (!) 21 (!) 21  20  Temp:  98.9 F (37.2 C)  98.2 F (36.8 C)  TempSrc:  Oral  Oral  SpO2: 94% 96% 98% 94%    Intake/Output Summary (Last 24 hours) at 04/11/2018 1044 Last  data filed at 04/11/2018 0400 Gross per 24 hour  Intake 1580 ml  Output 1000 ml  Net 580 ml   There were no vitals filed for this visit.  Exam: In bed- deep cough- on 3L [place] Coarse breath sounds- occasional wheezing rrr Tangential thoughts, impulsive Alert and oriented Poor dentation Dis-heaveled  Data Reviewed:   I have personally reviewed following labs and  imaging studies:  Labs: Labs show the following:   Basic Metabolic Panel: Recent Labs  Lab 04/10/18 1619 04/11/18 0502  NA 127* 137  K 4.3 4.1  CL 85* 99  CO2 26 31  GLUCOSE 306* 174*  BUN <5* 8  CREATININE 0.84 0.61  CALCIUM 9.2 8.6*   GFR CrCl cannot be calculated (Unknown ideal weight.). Liver Function Tests: Recent Labs  Lab 04/10/18 1619  AST 58*  ALT 47*  ALKPHOS 118  BILITOT 0.7  PROT 7.2  ALBUMIN 3.2*   No results for input(s): LIPASE, AMYLASE in the last 168 hours. No results for input(s): AMMONIA in the last 168 hours. Coagulation profile No results for input(s): INR, PROTIME in the last 168 hours.  CBC: Recent Labs  Lab 04/10/18 1619  WBC 8.8  NEUTROABS 7.9*  HGB 17.0  HCT 51.6  MCV 97.0  PLT 239   Cardiac Enzymes: No results for input(s): CKTOTAL, CKMB, CKMBINDEX, TROPONINI in the last 168 hours. BNP (last 3 results) No results for input(s): PROBNP in the last 8760 hours. CBG: Recent Labs  Lab 04/11/18 0232 04/11/18 0654  GLUCAP 194* 199*   D-Dimer: No results for input(s): DDIMER in the last 72 hours. Hgb A1c: Recent Labs    04/11/18 0502  HGBA1C 7.7*   Lipid Profile: No results for input(s): CHOL, HDL, LDLCALC, TRIG, CHOLHDL, LDLDIRECT in the last 72 hours. Thyroid function studies: No results for input(s): TSH, T4TOTAL, T3FREE, THYROIDAB in the last 72 hours.  Invalid input(s): FREET3 Anemia work up: No results for input(s): VITAMINB12, FOLATE, FERRITIN, TIBC, IRON, RETICCTPCT in the last 72 hours. Sepsis Labs: Recent Labs  Lab 04/10/18 1617 04/10/18 1619 04/10/18 1941 04/11/18 0502  WBC  --  8.8  --   --   LATICACIDVEN 4.47*  --  2.33* 1.1    Microbiology Recent Results (from the past 240 hour(s))  Blood Culture (routine x 2)     Status: None (Preliminary result)   Collection Time: 04/10/18  7:33 PM  Result Value Ref Range Status   Specimen Description BLOOD LEFT ANTECUBITAL  Final   Special Requests   Final     BOTTLES DRAWN AEROBIC AND ANAEROBIC Blood Culture adequate volume   Culture   Final    NO GROWTH < 12 HOURS Performed at [facility], [place] [address], [place], [place] [address]    Report Status PENDING  Incomplete    Procedures and diagnostic studies:  Dg Chest 2 View  Result Date: 04/10/2018 CLINICAL DATA:  Cough EXAM: CHEST - 2 VIEW COMPARISON:  08/09/2013 FINDINGS: Diffuse interstitial opacity suspect for interstitial inflammatory process versus interstitial edema. Normal heart size. No pleural effusion. No pneumothorax. Slight increased hilar opacity. IMPRESSION: Diffuse interstitial opacity, suspect for interstitial inflammation or edema. No focal consolidation. Increased density within the hilar regions bilaterally, possible nodes. Electronically Signed   By: Donavan Foil M.D.   On: 04/10/2018 17:55   Ct Angio Chest Pe W And/or Wo Contrast  Result Date: 04/10/2018 CLINICAL DATA:  Shortness of breath with cough EXAM: CT ANGIOGRAPHY CHEST WITH CONTRAST TECHNIQUE:  Multidetector CT imaging of the chest was performed using the standard protocol during bolus administration of intravenous contrast. Multiplanar CT image reconstructions and MIPs were obtained to evaluate the vascular anatomy. CONTRAST:  75 mL ISOVUE-370 IOPAMIDOL (ISOVUE-370) INJECTION 76% COMPARISON:  Chest radiograph Ju April 10, 2018 FINDINGS: Cardiovascular: There is no demonstrable pulmonary embolus. There is no thoracic aortic aneurysm or dissection. The visualized great vessels appear normal. There are foci of aortic atherosclerosis. There is no pericardial effusion or pericardial thickening. Prominence of the main pulmonary outflow tract is noted with a measured transverse diameter of 3.2 cm. Mediastinum/Nodes: Thyroid appears normal. There are multiple subcentimeter lymph nodes scattered throughout the mediastinum. There is a lymph node in the right hilum measuring 1.8 x 1.4 cm. There is a lymph node in the left  hilum measuring 1.4 x 1.1 cm. There is a lymph node anterior to the carina measuring 1.1 x 0.9 cm. No esophageal lesions are evident. Lungs/Pleura: There are small bullae in each upper lobe. There are tree on bud type opacities in the right upper lobe consistent with pneumonia. A focal area of irregular nodular opacity in the posterior segment of the right upper lobe measures 1.7 x 1.2 cm, best seen on axial slice 44 series 8. There is atelectatic change in the right middle lobe. There is a somewhat nodular opacity abutting the pleura in the lateral segment right middle lobe measuring 1.1 x 0.8 cm. Atelectatic changes noted in the posterior left base. No pleural effusion or pleural thickening evident. Upper Abdomen: There is hepatic steatosis. Visualized upper abdominal structures otherwise appear unremarkable. Musculoskeletal: There are no blastic or lytic bone lesions. No chest wall lesions are evident. Review of the MIP images confirms the above findings. IMPRESSION: 1. No demonstrable pulmonary embolus. No thoracic aortic aneurysm or dissection. There are foci of aortic atherosclerosis. 2. Prominence the main pulmonary outflow tract, a finding felt to be indicative of a degree of pulmonary arterial hypertension. 3. There is patchy tree on bud type appearance in the right upper lobe, felt to represent pneumonia in this area. There are areas of patchy atelectasis bilaterally. 4. There is a somewhat nodular appearing opacity in the posterior segment of the right upper lobe measuring 1.7 x 1.2 cm. A second nodular opacity in the right middle lobe laterally measures 1.1 x 0.8 cm. While these areas may represent foci pneumonia or atelectasis, neoplastic etiology in these areas cannot be excluded. It may be prudent to consider nuclear medicine PET study given these findings. At a minimum, a follow-up chest CT in 4-6 weeks to assess for potential clearing of areas of infiltrate may be advisable. 5. Several prominent  lymph nodes noted. Etiology for adenopathy uncertain. Again, nuclear medicine PET study could be helpful to assess for abnormal metabolic activity in these lymph nodes which would heightened suspicion for potential pneumonia. 6.  Hepatic steatosis. Aortic Atherosclerosis (ICD10-I70.0). Electronically Signed   By: Lowella Grip III M.D.   On: 04/10/2018 20:30    Medications:   . dextromethorphan-guaiFENesin  2 tablet Oral BID  . enoxaparin (LOVENOX) injection  40 mg Subcutaneous Daily  . folic acid  1 mg Oral Daily  . insulin aspart  0-9 Units Subcutaneous TID WC  . ipratropium-albuterol  3 mL Nebulization Q6H  . multivitamin with minerals  1 tablet Oral Daily  . nicotine  21 mg Transdermal Daily  . thiamine  100 mg Oral Daily   Or  . thiamine  100 mg Intravenous Daily  Continuous Infusions: . azithromycin Stopped (04/10/18 2031)  . cefTRIAXone (ROCEPHIN)  IV Stopped (04/10/18 1849)     LOS: 0 days   Geradine Girt  [facility]   *Please refer to [email or web address], password [email or web address] to get updated schedule on who will round on this patient, as hospitalists switch teams weekly. If 7PM-7AM, please contact night-coverage at [email or web address], password [email or web address] for any overnight needs.  04/11/2018, 10:44 AM

## 2018-04-12 DIAGNOSIS — J181 Lobar pneumonia, unspecified organism: Secondary | ICD-10-CM

## 2018-04-12 DIAGNOSIS — J9621 Acute and chronic respiratory failure with hypoxia: Secondary | ICD-10-CM

## 2018-04-12 DIAGNOSIS — E119 Type 2 diabetes mellitus without complications: Secondary | ICD-10-CM

## 2018-04-12 LAB — CBC
HCT: 45.2 % (ref 39.0–52.0)
Hemoglobin: 14.2 g/dL (ref 13.0–17.0)
MCH: 31.8 pg (ref 26.0–34.0)
MCHC: 31.4 g/dL (ref 30.0–36.0)
MCV: 101.1 fL — ABNORMAL HIGH (ref 80.0–100.0)
Platelets: 225 10*3/uL (ref 150–400)
RBC: 4.47 MIL/uL (ref 4.22–5.81)
RDW: 13.6 % (ref 11.5–15.5)
WBC: 9.3 10*3/uL (ref 4.0–10.5)
nRBC: 0 % (ref 0.0–0.2)

## 2018-04-12 LAB — GLUCOSE, CAPILLARY
Glucose-Capillary: 192 mg/dL — ABNORMAL HIGH (ref 70–99)
Glucose-Capillary: 150 mg/dL — ABNORMAL HIGH (ref 70–99)
Glucose-Capillary: 250 mg/dL — ABNORMAL HIGH (ref 70–99)
Glucose-Capillary: 212 mg/dL — ABNORMAL HIGH (ref 70–99)

## 2018-04-12 LAB — BASIC METABOLIC PANEL
Anion gap: 7 (ref 5–15)
BUN: 7 mg/dL (ref 6–20)
CO2: 32 mmol/L (ref 22–32)
Calcium: 8.5 mg/dL — ABNORMAL LOW (ref 8.9–10.3)
Chloride: 94 mmol/L — ABNORMAL LOW (ref 98–111)
Creatinine, Ser: 0.72 mg/dL (ref 0.61–1.24)
GFR calc Af Amer: >60 mL/min (ref >60)
GFR calc non Af Amer: >60 mL/min (ref >60)
Glucose, Bld: 287 mg/dL — ABNORMAL HIGH (ref 70–99)
POTASSIUM: 3.7 mmol/L (ref 3.5–5.1)
SODIUM: 133 mmol/L — AB (ref 135–145)

## 2018-04-12 MED ORDER — INSULIN ASPART 100 UNIT/ML ~~LOC~~ SOLN
4.0000 [IU] | Freq: Three times a day (TID) | SUBCUTANEOUS | Status: DC
  Administered 2018-04-12 – 2018-04-16 (×12): 4 [IU] via SUBCUTANEOUS

## 2018-04-12 MED ORDER — INSULIN ASPART 100 UNIT/ML ~~LOC~~ SOLN
0.0000 [IU] | Freq: Three times a day (TID) | SUBCUTANEOUS | Status: DC
  Administered 2018-04-12: 2 [IU] via SUBCUTANEOUS
  Administered 2018-04-12: 5 [IU] via SUBCUTANEOUS
  Administered 2018-04-13: 2 [IU] via SUBCUTANEOUS
  Administered 2018-04-13: 3 [IU] via SUBCUTANEOUS
  Administered 2018-04-13: 8 [IU] via SUBCUTANEOUS
  Administered 2018-04-14: 2 [IU] via SUBCUTANEOUS
  Administered 2018-04-14: 3 [IU] via SUBCUTANEOUS
  Administered 2018-04-14: 2 [IU] via SUBCUTANEOUS
  Administered 2018-04-15 (×2): 5 [IU] via SUBCUTANEOUS
  Administered 2018-04-15: 11 [IU] via SUBCUTANEOUS
  Administered 2018-04-16: 15 [IU] via SUBCUTANEOUS
  Administered 2018-04-16: 8 [IU] via SUBCUTANEOUS
  Administered 2018-04-16: 3 [IU] via SUBCUTANEOUS
  Administered 2018-04-17: 5 [IU] via SUBCUTANEOUS

## 2018-04-12 MED ORDER — SODIUM CHLORIDE 0.9 % IV SOLN
INTRAVENOUS | Status: DC | PRN
  Administered 2018-04-12: 18:00:00 via INTRAVENOUS

## 2018-04-12 MED ORDER — LIVING WELL WITH DIABETES BOOK
Freq: Once | Status: AC
  Administered 2018-04-12: 15:00:00
  Filled 2018-04-12 (×2): qty 1

## 2018-04-12 NOTE — Progress Notes (Signed)
PROGRESS NOTE                                                                                                                                                                                                             Patient Demographics:    Alex Martinez, is a 53 y.o. male, DOB - Oct 03, 1965, TML:[id number]  Admit date - 04/10/2018   Admitting Physician Shela Leff, MD  Outpatient Primary MD for the patient is Leonard Downing, MD  LOS - 1  Outpatient Specialists: None  Chief Complaint  Patient presents with  . Pneumonia       Brief Narrative   53 year old male with history of tobacco use, alcohol use, prediabetes sent from PCP office for evaluation of possible pneumonia.  He was sent on 2 L O2 via nasal cannula.  Patient reported 3-4 days of increasing shortness of breath with productive clear sputum without any fevers, chills.  Reported decreased p.o. intake.  He reports being told of having prediabetes. Patient found to be septic secondary to lobar pneumonia along with acute hypoxic respiratory failure.   Subjective:   Patient reports having shortness of breath on exertion.  Still having some cough with clear phlegm but better since admission.   Assessment  & Plan :    Principal Problem: Sepsis with lobar pneumonia Acute respiratory failure with hypoxia ([place]) Sepsis currently resolved.  Afebrile.  Lactic acid normalized with IV fluids.  Flu PCR negative.  CT angiogram negative for PE, right upper lobe?  Opacity suspicious for pneumonia. Continue empiric Rocephin and azithromycin.  Transition to oral prednisone.  Scheduled DuoNeb and PRN albuterol neb.  Continue antitussive.  Fluid discontinued. Blood cultures negative. Continue to monitor on supplemental oxygen.  Wean as tolerated.  Will need outpatient PFT.  Active Problems:    Pulmonary nodules Nodular opacity in the posterior right upper lobe,  right middle lobe.  Cannot exclude neoplastic etiology.  Recommends follow-up chest CT in 4-6 weeks for clearing and possible PET scan if persistent.  Tobacco abuse Smokes almost 2 packs/day.  Counseled strongly on cessation.  Nicotine patch.  Diabetes mellitus type 2 A1c of 7.7.  Reported history of prediabetes.  Currently monitored on sliding scale coverage.  Diabetic coronary consulted for education.  Will benefit from starting on oral hypoglycemic.  Alcohol abuse Counseled strongly on cessation.  No signs of withdrawal.  Monitor on CIWA.    Code Status : Full code  Family Communication  : None at bedside  Disposition Plan  : Home possibly in 1-2 days if clinically improved  Barriers For Discharge : Active symptoms  Consults  : None  Procedures  : None  DVT Prophylaxis  :  Lovenox -   Lab Results  Component Value Date   PLT 225 04/12/2018    Antibiotics  :    Anti-infectives (From admission, onward)   Start     Dose/Rate Route Frequency Ordered Stop   04/10/18 1730  cefTRIAXone (ROCEPHIN) 2 g in sodium chloride 0.9 % 100 mL IVPB     2 g 200 mL/hr over 30 Minutes Intravenous Every 24 hours 04/10/18 1727     04/10/18 1730  azithromycin (ZITHROMAX) 500 mg in sodium chloride 0.9 % 250 mL IVPB     500 mg 250 mL/hr over 60 Minutes Intravenous Every 24 hours 04/10/18 1727          Objective:   Vitals:   04/12/18 0000 04/12/18 0136 04/12/18 0655 04/12/18 1214  BP: 139/89  132/88 (!) 138/92  Pulse: 99  95 97  Resp: _0 Temp: 98.4 F (36.9 C)  98.2 F (36.8 C) 98.4 F (36.9 C)  TempSrc: Oral  Oral Oral  SpO2: 94% 95% 96% 96%  Weight:      Height:        Wt Readings from Last 3 Encounters:  04/11/18 106.6 kg  08/03/13 97.7 kg  03/26/11 96.2 kg     Intake/Output Summary (Last 24 hours) at 04/12/2018 1412 Last data filed at 04/11/2018 2300 Gross per 24 hour  Intake 590 ml  Output -  Net 590 ml     Physical Exam  Gen: not in distress HEENT:  moist mucosa, supple neck Chest: Scattered rhonchi bilaterally CVS: N S1&S2, no murmurs,  GI: soft, NT, ND, BS+ Musculoskeletal: warm, no edema     Data Review:    CBC Recent Labs  Lab 04/10/18 1619 04/12/18 0400  WBC 8.8 9.3  HGB 17.0 14.2  HCT 51.6 45.2  PLT 239 225  MCV 97.0 101.1*  MCH 32.0 31.8  MCHC 32.9 31.4  RDW 13.0 13.6  LYMPHSABS 0.5*  --   MONOABS 0.2  --   EOSABS 0.2  --   BASOSABS 0.0  --     Chemistries  Recent Labs  Lab 04/10/18 1619 04/11/18 0502 04/12/18 0400  NA 127* 137 133*  K 4.3 4.1 3.7  CL 85* 99 94*  CO2 26 31 32  GLUCOSE 306* 174* 287*  BUN <5* 8 7  CREATININE 0.84 0.61 0.72  CALCIUM 9.2 8.6* 8.5*  AST 58*  --   --   ALT 47*  --   --   ALKPHOS 118  --   --   BILITOT 0.7  --   --    ------------------------------------------------------------------------------------------------------------------ No results for input(s): CHOL, HDL, LDLCALC, TRIG, CHOLHDL, LDLDIRECT in the last 72 hours.  Lab Results  Component Value Date   HGBA1C 7.7 (H) 04/11/2018   ------------------------------------------------------------------------------------------------------------------ No results for input(s): TSH, T4TOTAL, T3FREE, THYROIDAB in the last 72 hours.  Invalid input(s): FREET3 ------------------------------------------------------------------------------------------------------------------ No results for input(s): VITAMINB12, FOLATE, FERRITIN, TIBC, IRON, RETICCTPCT in the last 72 hours.  Coagulation profile No results for input(s): INR, PROTIME in the last 168 hours.  No results for input(s): DDIMER in the last 72 hours.  Cardiac  Enzymes No results for input(s): CKMB, TROPONINI, MYOGLOBIN in the last 168 hours.  Invalid input(s): CK ------------------------------------------------------------------------------------------------------------------    Component Value Date/Time   BNP 191.0 (H) 04/10/2018 1930    Inpatient  Medications  Scheduled Meds: . dextromethorphan-guaiFENesin  2 tablet Oral BID  . enoxaparin (LOVENOX) injection  40 mg Subcutaneous Daily  . folic acid  1 mg Oral Daily  . insulin aspart  0-15 Units Subcutaneous TID WC  . insulin aspart  4 Units Subcutaneous TID WC  . ipratropium-albuterol  3 mL Nebulization Q6H  . living well with diabetes book   Does not apply Once  . multivitamin with minerals  1 tablet Oral Daily  . nicotine  21 mg Transdermal Daily  . predniSONE  40 mg Oral Q breakfast  . thiamine  100 mg Oral Daily   Or  . thiamine  100 mg Intravenous Daily   Continuous Infusions: . azithromycin 500 mg (04/11/18 1717)  . cefTRIAXone (ROCEPHIN)  IV 2 g (04/11/18 1718)   PRN Meds:.acetaminophen **OR** acetaminophen, albuterol, LORazepam **OR** LORazepam  Micro Results Recent Results (from the past 240 hour(s))  Blood Culture (routine x 2)     Status: None (Preliminary result)   Collection Time: 04/10/18  7:33 PM  Result Value Ref Range Status   Specimen Description BLOOD LEFT ANTECUBITAL  Final   Special Requests   Final    BOTTLES DRAWN AEROBIC AND ANAEROBIC Blood Culture adequate volume   Culture   Final    NO GROWTH 2 DAYS Performed at [facility], [place] [address], [place], [place] [address]    Report Status PENDING  Incomplete  Blood Culture (routine x 2)     Status: None (Preliminary result)   Collection Time: 04/11/18  5:03 AM  Result Value Ref Range Status   Specimen Description BLOOD RIGHT ANTECUBITAL  Final   Special Requests   Final    BOTTLES DRAWN AEROBIC ONLY Blood Culture adequate volume   Culture   Final    NO GROWTH 1 DAY Performed at [facility], [place] [address] [place], [place] [address]    Report Status PENDING  Incomplete    Radiology Reports Dg Chest 2 View  Result Date: 04/10/2018 CLINICAL DATA:  Cough EXAM: CHEST - 2 VIEW COMPARISON:  08/09/2013 FINDINGS: Diffuse interstitial opacity suspect for interstitial inflammatory  process versus interstitial edema. Normal heart size. No pleural effusion. No pneumothorax. Slight increased hilar opacity. IMPRESSION: Diffuse interstitial opacity, suspect for interstitial inflammation or edema. No focal consolidation. Increased density within the hilar regions bilaterally, possible nodes. Electronically Signed   By: Donavan Foil M.D.   On: 04/10/2018 17:55   Ct Angio Chest Pe W And/or Wo Contrast  Result Date: 04/10/2018 CLINICAL DATA:  Shortness of breath with cough EXAM: CT ANGIOGRAPHY CHEST WITH CONTRAST TECHNIQUE: Multidetector CT imaging of the chest was performed using the standard protocol during bolus administration of intravenous contrast. Multiplanar CT image reconstructions and MIPs were obtained to evaluate the vascular anatomy. CONTRAST:  75 mL ISOVUE-370 IOPAMIDOL (ISOVUE-370) INJECTION 76% COMPARISON:  Chest radiograph Ju April 10, 2018 FINDINGS: Cardiovascular: There is no demonstrable pulmonary embolus. There is no thoracic aortic aneurysm or dissection. The visualized great vessels appear normal. There are foci of aortic atherosclerosis. There is no pericardial effusion or pericardial thickening. Prominence of the main pulmonary outflow tract is noted with a measured transverse diameter of 3.2 cm. Mediastinum/Nodes: Thyroid appears normal. There are multiple subcentimeter lymph nodes scattered throughout  the mediastinum. There is a lymph node in the right hilum measuring 1.8 x 1.4 cm. There is a lymph node in the left hilum measuring 1.4 x 1.1 cm. There is a lymph node anterior to the carina measuring 1.1 x 0.9 cm. No esophageal lesions are evident. Lungs/Pleura: There are small bullae in each upper lobe. There are tree on bud type opacities in the right upper lobe consistent with pneumonia. A focal area of irregular nodular opacity in the posterior segment of the right upper lobe measures 1.7 x 1.2 cm, best seen on axial slice 44 series 8. There is atelectatic change in  the right middle lobe. There is a somewhat nodular opacity abutting the pleura in the lateral segment right middle lobe measuring 1.1 x 0.8 cm. Atelectatic changes noted in the posterior left base. No pleural effusion or pleural thickening evident. Upper Abdomen: There is hepatic steatosis. Visualized upper abdominal structures otherwise appear unremarkable. Musculoskeletal: There are no blastic or lytic bone lesions. No chest wall lesions are evident. Review of the MIP images confirms the above findings. IMPRESSION: 1. No demonstrable pulmonary embolus. No thoracic aortic aneurysm or dissection. There are foci of aortic atherosclerosis. 2. Prominence the main pulmonary outflow tract, a finding felt to be indicative of a degree of pulmonary arterial hypertension. 3. There is patchy tree on bud type appearance in the right upper lobe, felt to represent pneumonia in this area. There are areas of patchy atelectasis bilaterally. 4. There is a somewhat nodular appearing opacity in the posterior segment of the right upper lobe measuring 1.7 x 1.2 cm. A second nodular opacity in the right middle lobe laterally measures 1.1 x 0.8 cm. While these areas may represent foci pneumonia or atelectasis, neoplastic etiology in these areas cannot be excluded. It may be prudent to consider nuclear medicine PET study given these findings. At a minimum, a follow-up chest CT in 4-6 weeks to assess for potential clearing of areas of infiltrate may be advisable. 5. Several prominent lymph nodes noted. Etiology for adenopathy uncertain. Again, nuclear medicine PET study could be helpful to assess for abnormal metabolic activity in these lymph nodes which would heightened suspicion for potential pneumonia. 6.  Hepatic steatosis. Aortic Atherosclerosis (ICD10-I70.0). Electronically Signed   By: Lowella Grip III M.D.   On: 04/10/2018 20:30    Time Spent in minutes  25   Maiah Sinning M.D on 04/12/2018 at 2:12 PM  Between 7am to  7pm - Pager - [phone]  After 7pm go to [email or web address] - password [facility]  [facility] -  Office  [phone]

## 2018-04-12 NOTE — Progress Notes (Addendum)
Inpatient Diabetes Program Recommendations  AACE/ADA: New Consensus Statement on Inpatient Glycemic Control (2015)  Target Ranges:  Prepandial:   less than 140 mg/dL      Peak postprandial:   less than 180 mg/dL (1-2 hours)      Critically ill patients:  140 - 180 mg/dL   Lab Results  Component Value Date   GLUCAP 192 (H) 04/12/2018   HGBA1C 7.7 (H) 04/11/2018    Review of Glycemic Control Results for Alex Martinez, Alex Martinez (MRN [id number]) as of 04/12/2018 09:49  Ref. Range 04/11/2018 06:54 04/11/2018 11:58 04/11/2018 17:05 04/11/2018 21:26 04/12/2018 06:27  Glucose-Capillary Latest Ref Range: 70 - 99 mg/dL 199 (H) 233 (H) 240 (H) 266 (H) 192 (H)   Diabetes history: Pre diabetes diagnosis Outpatient Diabetes medications: None Current orders for Inpatient glycemic control: Novolog 4 units tid + Novolog moderate tid  Inpatient Diabetes Program Recommendations:   Noted patient has previous hx prediabetes. Ordered Living Well with Diabetes book, dietician consult, and patient education videos. Will plan to meet with patient. 15:00 Met with patient @ bedside. Patient states he was told 1 year ago that he had prediabetes but didn't understand what to do differently.  Spoke with pt about new diagnosis. Discussed A1C results with them and explained what an A1C is, basic pathophysiology of DM Type 2, basic home care, basic diabetes diet nutrition principles, importance of checking CBGs and maintaining good CBG control to prevent long-term and short-term complications.RNs to provide ongoing basic DM education at bedside with this patient. Have ordered educational booklet, DM videos, and have also placed RD consult for DM diet education for this patient.  Reviewed with patient need for glucose meter and the cheapest meter is Relion from [employer] but patient states he cannot afford the money to buy the meter and strips.  Thank you, Nani Gasser. Eugina Row, RN, MSN, CDE  Diabetes Coordinator Inpatient Glycemic Control  Team Team Pager [phone] (8am-5pm) 04/12/2018 9:51 AM

## 2018-04-13 DIAGNOSIS — R918 Other nonspecific abnormal finding of lung field: Secondary | ICD-10-CM

## 2018-04-13 LAB — GLUCOSE, CAPILLARY
GLUCOSE-CAPILLARY: 271 mg/dL — AB (ref 70–99)
Glucose-Capillary: 136 mg/dL — ABNORMAL HIGH (ref 70–99)
Glucose-Capillary: 153 mg/dL — ABNORMAL HIGH (ref 70–99)
Glucose-Capillary: 153 mg/dL — ABNORMAL HIGH (ref 70–99)

## 2018-04-13 MED ORDER — IPRATROPIUM-ALBUTEROL 0.5-2.5 (3) MG/3ML IN SOLN
3.0000 mL | RESPIRATORY_TRACT | Status: DC
  Administered 2018-04-13 (×2): 3 mL via RESPIRATORY_TRACT
  Filled 2018-04-13: qty 3

## 2018-04-13 MED ORDER — IPRATROPIUM-ALBUTEROL 0.5-2.5 (3) MG/3ML IN SOLN
3.0000 mL | Freq: Three times a day (TID) | RESPIRATORY_TRACT | Status: DC
  Administered 2018-04-14 – 2018-04-16 (×7): 3 mL via RESPIRATORY_TRACT
  Filled 2018-04-13 (×7): qty 3

## 2018-04-13 MED ORDER — INSULIN GLARGINE 100 UNIT/ML ~~LOC~~ SOLN
7.0000 [IU] | Freq: Every day | SUBCUTANEOUS | Status: DC
  Administered 2018-04-13 – 2018-04-15 (×3): 7 [IU] via SUBCUTANEOUS
  Filled 2018-04-13 (×4): qty 0.07

## 2018-04-13 NOTE — Progress Notes (Signed)
Oxygen Saturation  At rest on Room Air = 87%  Ambulating on Room Air = 83%  Ambulating with 2 Liters [place] = 93%

## 2018-04-13 NOTE — Progress Notes (Addendum)
PROGRESS NOTE                                                                                                                                                                                                             Patient Demographics:    Alex Martinez, is a 53 y.o. male, DOB - [birth year], VQX:[id number]  Admit date - 04/10/2018   Admitting Physician Shela Leff, MD  Outpatient Primary MD for the patient is Leonard Downing, MD  LOS - 2  Outpatient Specialists: None  Chief Complaint  Patient presents with  . Pneumonia       Brief Narrative   53 year old male with history of tobacco use, alcohol use, prediabetes sent from PCP office for evaluation of possible pneumonia.  He was sent on 2 L O2 via nasal cannula.  Patient reported 3-4 days of increasing shortness of breath with productive clear sputum without any fevers, chills.  Reported decreased p.o. intake.  He reports being told of having prediabetes. Patient found to be septic secondary to lobar pneumonia along with acute hypoxic respiratory failure.   Subjective:   Reports his breathing slightly better from yesterday but still short of breath and wheezing on minimal exertion.   Assessment  & Plan :    Principal Problem: Sepsis with lobar pneumonia Acute respiratory failure with hypoxia ([place]) Sepsis has resolved.  Remains afebrile and lactic acid normalized with IV fluids.  Blood cultures and flu PCR negative. CT angiogram negative for PE but shows possible right upper lobe pneumonia. Continue Rocephin.  Completed 3 days of azithromycin. Continue scheduled DuoNeb and PRN albuterol neb, antitussives.   Patient gets tachycardic to 130s on minimal exertion.  Off IV fluids.  2D echo shows EF of 60-65% with no wall motion abnormality and grade 2 diastolic dysfunction.  Patient  noted to desat to 83% on room air both at rest and ambulation.  Improved  on 2 L via nasal cannula to 93%.  Will reassess for home O2 need when his getting closer to discharge.  active Problems:    Pulmonary nodules Nodular opacity in the posterior right upper lobe, right middle lobe.  Cannot exclude neoplastic etiology.  Recommends follow-up chest CT in 4-6 weeks for clearing and possible PET scan if persistent.  Tobacco abuse Smokes almost 2 packs/day.  Counseled strongly on cessation.  Nicotine patch.  Diabetes mellitus type 2 A1c of 7.7.  Reported history of prediabetes.  CBG elevated.  Added pre-meal aspart and low-dose Lantus at bedtime.  Diabetic coronary consulted for education.  Will benefit from starting on oral hypoglycemic.  Alcohol abuse Counseled strongly on cessation.  Does get tachycardic on minimal exertion but no signs of acute withdrawal.  CIWA of 2.  Continue with PRN Ativan, thiamine folate and multivitamin.  Hyponatremia  Possibly due to dehydration with sepsis.  Now resolved with fluids.  Code Status : Full code  Family Communication  : None at bedside  Disposition Plan  : Home possibly in the next 48 hours if improved  Barriers For Discharge : Active symptoms  Consults  : None  Procedures  : None  DVT Prophylaxis  :  Lovenox -   Lab Results  Component Value Date   PLT 225 04/12/2018    Antibiotics  :    Anti-infectives (From admission, onward)   Start     Dose/Rate Route Frequency Ordered Stop   04/10/18 1730  cefTRIAXone (ROCEPHIN) 2 g in sodium chloride 0.9 % 100 mL IVPB     2 g 200 mL/hr over 30 Minutes Intravenous Every 24 hours 04/10/18 1727     04/10/18 1730  azithromycin (ZITHROMAX) 500 mg in sodium chloride 0.9 % 250 mL IVPB     500 mg 250 mL/hr over 60 Minutes Intravenous Every 24 hours 04/10/18 1727          Objective:   Vitals:   04/12/18 1814 04/12/18 2105 04/12/18 2332 04/13/18 0504  BP: (!) 147/85  (!) 154/94 (!) 148/98  Pulse: (!) 104  86 87  Resp: _0 Temp: 98.2 F (36.8 C)  97.9 F  (36.6 C) 98.3 F (36.8 C)  TempSrc: Oral  Oral Oral  SpO2: 93% 91% 92% 100%  Weight:      Height:        Wt Readings from Last 3 Encounters:  04/11/18 106.6 kg  08/03/13 97.7 kg  03/26/11 96.2 kg     Intake/Output Summary (Last 24 hours) at 04/13/2018 1330 Last data filed at 04/13/2018 1000 Gross per 24 hour  Intake 516.05 ml  Output -  Net 516.05 ml   Physical exam Fatigued, not in distress HEENT: Moist mucosa, supple neck Chest: Diffuse rhonchi bilaterally (unchanged from yesterday) CVs: Normal S1-S2, no murmurs GI: Soft, nondistended, nontender Musculoskeletal: Warm, no edema       Data Review:    CBC Recent Labs  Lab 04/10/18 1619 04/12/18 0400  WBC 8.8 9.3  HGB 17.0 14.2  HCT 51.6 45.2  PLT 239 225  MCV 97.0 101.1*  MCH 32.0 31.8  MCHC 32.9 31.4  RDW 13.0 13.6  LYMPHSABS 0.5*  --   MONOABS 0.2  --   EOSABS 0.2  --   BASOSABS 0.0  --     Chemistries  Recent Labs  Lab 04/10/18 1619 04/11/18 0502 04/12/18 0400  NA 127* 137 133*  K 4.3 4.1 3.7  CL 85* 99 94*  CO2 26 31 32  GLUCOSE 306* 174* 287*  BUN <5* 8 7  CREATININE 0.84 0.61 0.72  CALCIUM 9.2 8.6* 8.5*  AST 58*  --   --   ALT 47*  --   --   ALKPHOS 118  --   --   BILITOT 0.7  --   --    ------------------------------------------------------------------------------------------------------------------ No results for input(s): CHOL, HDL, LDLCALC, TRIG, CHOLHDL,  LDLDIRECT in the last 72 hours.  Lab Results  Component Value Date   HGBA1C 7.7 (H) 04/11/2018   ------------------------------------------------------------------------------------------------------------------ No results for input(s): TSH, T4TOTAL, T3FREE, THYROIDAB in the last 72 hours.  Invalid input(s): FREET3 ------------------------------------------------------------------------------------------------------------------ No results for input(s): VITAMINB12, FOLATE, FERRITIN, TIBC, IRON, RETICCTPCT in the last 72  hours.  Coagulation profile No results for input(s): INR, PROTIME in the last 168 hours.  No results for input(s): DDIMER in the last 72 hours.  Cardiac Enzymes No results for input(s): CKMB, TROPONINI, MYOGLOBIN in the last 168 hours.  Invalid input(s): CK ------------------------------------------------------------------------------------------------------------------    Component Value Date/Time   BNP 191.0 (H) 04/10/2018 1930    Inpatient Medications  Scheduled Meds: . dextromethorphan-guaiFENesin  2 tablet Oral BID  . enoxaparin (LOVENOX) injection  40 mg Subcutaneous Daily  . folic acid  1 mg Oral Daily  . insulin aspart  0-15 Units Subcutaneous TID WC  . insulin aspart  4 Units Subcutaneous TID WC  . ipratropium-albuterol  3 mL Nebulization Q6H  . multivitamin with minerals  1 tablet Oral Daily  . nicotine  21 mg Transdermal Daily  . predniSONE  40 mg Oral Q breakfast  . thiamine  100 mg Oral Daily   Or  . thiamine  100 mg Intravenous Daily   Continuous Infusions: . sodium chloride 10 mL/hr at 04/12/18 1740  . azithromycin 500 mg (04/12/18 1835)  . cefTRIAXone (ROCEPHIN)  IV 2 g (04/12/18 1742)   PRN Meds:.sodium chloride, acetaminophen **OR** acetaminophen, albuterol, LORazepam **OR** LORazepam  Micro Results Recent Results (from the past 240 hour(s))  Blood Culture (routine x 2)     Status: None (Preliminary result)   Collection Time: 04/10/18  7:33 PM  Result Value Ref Range Status   Specimen Description BLOOD LEFT ANTECUBITAL  Final   Special Requests   Final    BOTTLES DRAWN AEROBIC AND ANAEROBIC Blood Culture adequate volume   Culture   Final    NO GROWTH 3 DAYS Performed at [facility], [place] [address] [place], [place] [address]    Report Status PENDING  Incomplete  Blood Culture (routine x 2)     Status: None (Preliminary result)   Collection Time: 04/11/18  5:03 AM  Result Value Ref Range Status   Specimen Description BLOOD RIGHT  ANTECUBITAL  Final   Special Requests   Final    BOTTLES DRAWN AEROBIC ONLY Blood Culture adequate volume   Culture   Final    NO GROWTH 2 DAYS Performed at [facility], [place] [address] [place], [place] [address]    Report Status PENDING  Incomplete    Radiology Reports Dg Chest 2 View  Result Date: 04/10/2018 CLINICAL DATA:  Cough EXAM: CHEST - 2 VIEW COMPARISON:  08/09/2013 FINDINGS: Diffuse interstitial opacity suspect for interstitial inflammatory process versus interstitial edema. Normal heart size. No pleural effusion. No pneumothorax. Slight increased hilar opacity. IMPRESSION: Diffuse interstitial opacity, suspect for interstitial inflammation or edema. No focal consolidation. Increased density within the hilar regions bilaterally, possible nodes. Electronically Signed   By: Donavan Foil M.D.   On: 04/10/2018 17:55   Ct Angio Chest Pe W And/or Wo Contrast  Result Date: 04/10/2018 CLINICAL DATA:  Shortness of breath with cough EXAM: CT ANGIOGRAPHY CHEST WITH CONTRAST TECHNIQUE: Multidetector CT imaging of the chest was performed using the standard protocol during bolus administration of intravenous contrast. Multiplanar CT image reconstructions and MIPs were obtained to evaluate the vascular anatomy. CONTRAST:  75 mL ISOVUE-370 IOPAMIDOL (ISOVUE-370) INJECTION 76% COMPARISON:  Chest radiograph Ju April 10, 2018 FINDINGS: Cardiovascular: There is no demonstrable pulmonary embolus. There is no thoracic aortic aneurysm or dissection. The visualized great vessels appear normal. There are foci of aortic atherosclerosis. There is no pericardial effusion or pericardial thickening. Prominence of the main pulmonary outflow tract is noted with a measured transverse diameter of 3.2 cm. Mediastinum/Nodes: Thyroid appears normal. There are multiple subcentimeter lymph nodes scattered throughout the mediastinum. There is a lymph node in the right hilum measuring 1.8 x 1.4 cm. There is a lymph node  in the left hilum measuring 1.4 x 1.1 cm. There is a lymph node anterior to the carina measuring 1.1 x 0.9 cm. No esophageal lesions are evident. Lungs/Pleura: There are small bullae in each upper lobe. There are tree on bud type opacities in the right upper lobe consistent with pneumonia. A focal area of irregular nodular opacity in the posterior segment of the right upper lobe measures 1.7 x 1.2 cm, best seen on axial slice 44 series 8. There is atelectatic change in the right middle lobe. There is a somewhat nodular opacity abutting the pleura in the lateral segment right middle lobe measuring 1.1 x 0.8 cm. Atelectatic changes noted in the posterior left base. No pleural effusion or pleural thickening evident. Upper Abdomen: There is hepatic steatosis. Visualized upper abdominal structures otherwise appear unremarkable. Musculoskeletal: There are no blastic or lytic bone lesions. No chest wall lesions are evident. Review of the MIP images confirms the above findings. IMPRESSION: 1. No demonstrable pulmonary embolus. No thoracic aortic aneurysm or dissection. There are foci of aortic atherosclerosis. 2. Prominence the main pulmonary outflow tract, a finding felt to be indicative of a degree of pulmonary arterial hypertension. 3. There is patchy tree on bud type appearance in the right upper lobe, felt to represent pneumonia in this area. There are areas of patchy atelectasis bilaterally. 4. There is a somewhat nodular appearing opacity in the posterior segment of the right upper lobe measuring 1.7 x 1.2 cm. A second nodular opacity in the right middle lobe laterally measures 1.1 x 0.8 cm. While these areas may represent foci pneumonia or atelectasis, neoplastic etiology in these areas cannot be excluded. It may be prudent to consider nuclear medicine PET study given these findings. At a minimum, a follow-up chest CT in 4-6 weeks to assess for potential clearing of areas of infiltrate may be advisable. 5. Several  prominent lymph nodes noted. Etiology for adenopathy uncertain. Again, nuclear medicine PET study could be helpful to assess for abnormal metabolic activity in these lymph nodes which would heightened suspicion for potential pneumonia. 6.  Hepatic steatosis. Aortic Atherosclerosis (ICD10-I70.0). Electronically Signed   By: Lowella Grip III M.D.   On: 04/10/2018 20:30    Time Spent in minutes  25   Daxter Paule M.D on 04/13/2018 at 1:30 PM  Between 7am to 7pm - Pager - [phone]  After 7pm go to [email or web address] - password [facility]  [facility] -  Office  [phone]

## 2018-04-14 DIAGNOSIS — J441 Chronic obstructive pulmonary disease with (acute) exacerbation: Secondary | ICD-10-CM

## 2018-04-14 DIAGNOSIS — I5031 Acute diastolic (congestive) heart failure: Secondary | ICD-10-CM

## 2018-04-14 LAB — GLUCOSE, CAPILLARY
Glucose-Capillary: 126 mg/dL — ABNORMAL HIGH (ref 70–99)
Glucose-Capillary: 150 mg/dL — ABNORMAL HIGH (ref 70–99)
Glucose-Capillary: 175 mg/dL — ABNORMAL HIGH (ref 70–99)
Glucose-Capillary: 178 mg/dL — ABNORMAL HIGH (ref 70–99)

## 2018-04-14 MED ORDER — FUROSEMIDE 40 MG PO TABS
40.0000 mg | ORAL_TABLET | Freq: Two times a day (BID) | ORAL | Status: AC
  Administered 2018-04-14 – 2018-04-15 (×4): 40 mg via ORAL
  Filled 2018-04-14 (×4): qty 1

## 2018-04-14 MED ORDER — PREDNISONE 20 MG PO TABS
40.0000 mg | ORAL_TABLET | Freq: Two times a day (BID) | ORAL | Status: DC
  Administered 2018-04-14 – 2018-04-17 (×6): 40 mg via ORAL
  Filled 2018-04-14 (×6): qty 2

## 2018-04-14 NOTE — Progress Notes (Addendum)
Nutrition Consult/Education Note  RD consulted for nutrition education regarding prediabetes.   Lab Results  Component Value Date   HGBA1C 7.7 (H) 04/11/2018   RD provided "Heart-Healthy Consistent Carbohydrate Nutrition Therapy" handout from the Academy of Nutrition and Dietetics.   Discussed importance of controlled and consistent carbohydrate intake throughout the day. Encouraged consuming diet and/or sugar-free beverages including diet soda, Crystal Light, tea with an artifical sweetener and water.   Provided examples on ways to decrease sodium and fat intake in diet. Encouraged fresh fruits and vegetables as well as whole grain sources of carbohydrates to maximize fiber intake.  Expect poor compliance. Pt was not very willing in applying diet changes. He reveals he smokes 2 packs of cigarettes per day.  Body mass index is 33.72 kg/m. Pt meets criteria for Obesity Class I based on current BMI.  Current diet order is Heart Healthy/Carbohydrate Modified, patient is consuming approximately 75-100% of meals at this time.   Labs and medications reviewed. No further nutrition interventions warranted at this time.   If additional nutrition issues arise, please re-consult RD.  Arthur Holms, RD, LDN Pager #: [phone] After-Hours Pager #: [phone]

## 2018-04-14 NOTE — Care Management Note (Addendum)
Case Management Note  Patient Details  Name: Alex Martinez MRN: [id number] Date of Birth: Oct 04, 1965  Subjective/Objective:  From home alone, states he would like to keep his PCP , Renold Don at [facility].  He states he will need medication ast at dc, NCM assisted patient  with Match Letter and bus pass,  plan for dc on 1/13 if he improves.                   Action/Plan: DC home when ready.   Expected Discharge Date:                  Expected Discharge Plan:  Home/Self Care  In-House Referral:     Discharge planning Services  CM Consult, Medication Assistance, [place] [facility]  Post Acute Care Choice:    Choice offered to:     DME Arranged:    DME Agency:     HH Arranged:    HH Agency:     Status of Service:  Completed, signed off  If discussed at [employer] of Stay Meetings, dates discussed:    Additional Comments:  Zenon Mayo, RN 04/14/2018, 3:03 PM

## 2018-04-14 NOTE — Progress Notes (Addendum)
PROGRESS NOTE                                                                                                                                                                                                             Patient Demographics:    Alex Martinez, is a 53 y.o. male, DOB - 04-24-1965, YNX:[id number]  Admit date - 04/10/2018   Admitting Physician Shela Leff, MD  Outpatient Primary MD for the patient is Leonard Downing, MD  LOS - 3  Outpatient Specialists: None  Chief Complaint  Patient presents with  . Pneumonia       Brief Narrative   53 year old male with history of tobacco use, alcohol use, prediabetes sent from PCP office for evaluation of possible pneumonia.  He was sent on 2 L O2 via nasal cannula.  Patient reported 3-4 days of increasing shortness of breath with productive clear sputum without any fevers, chills.  Reported decreased p.o. intake.  He reports being told of having prediabetes. Patient found to be septic secondary to lobar pneumonia along with acute hypoxic respiratory failure.   Subjective:   She is still quite wheezy and coughing up some whitish phlegm.   Assessment  & Plan :    Principal Problem:  Acute respiratory failure with hypoxia (HCC) Secondary to lobar pneumonia and possibly COPD (newly diagnosed) with exacerbation. Patient septic on presentation, now resolved. Continue empiric Rocephin (treat for total 5 days).  Patient still actively wheezy.  Had stopped his prednisone yesterday but will resume again today (40 mg p.o. twice daily).  Continue scheduled DuoNeb every 4 hours, PRN albuterol neb and antitussive. Heart rate better today. I think patient does have underlying COPD, reports cough with whitish phlegm for several months and dyspnea on exertion from the same duration which has worsened 1 week prior to this hospitalization. CT angiogram negative for PE but  shows right upper lobe pneumonia. 2D echo shows EF of 60-65% with no wall motion abnormality and grade 2 diastolic dysfunction. I will also diurese him with p.o. Lasix 40 mg twice daily x3 doses.  Patient  noted to desat to 83% on room air both at rest and ambulation.  Improved on 2 L via nasal cannula to 93%.  Will reassess for home O2 need when his getting closer to discharge.  active Problems:  Pulmonary nodules Nodular opacity in the posterior right upper lobe, right middle lobe.  Cannot exclude neoplastic etiology.  Recommends follow-up chest CT in 4-6 weeks for clearing and possible PET scan if persistent.  Tobacco abuse Smokes almost 2 packs/day.  Counseled strongly on cessation.  Nicotine patch.  Diabetes mellitus type 2 A1c of 7.7.  Reported history of prediabetes.  CBG elevated.  CBG stable with low-dose pre-meal aspart and low-dose Lantus at bedtime.  Diabetic coordinator consulted.  Will start on oral hypoglycemic prior to discharge.  Alcohol abuse Counseled strongly on cessation.  Does get tachycardic on minimal exertion but no signs of acute withdrawal.    Continue with PRN Ativan, thiamine folate and multivitamin.  Hyponatremia  Possibly due to dehydration with sepsis.  Now resolved with fluids.  Code Status : Full code  Family Communication  : None at bedside  Disposition Plan  : Patient still actively wheezing and dyspneic, requiring continuous oxygen.  Will need continued breathing treatment, steroid and antibiotics along with few doses of Lasix.  Home possibly in the next 48-72 hours if clinically improved.  Barriers For Discharge : Active symptoms  Consults  : None  Procedures  : None  DVT Prophylaxis  :  Lovenox -   Lab Results  Component Value Date   PLT 225 04/12/2018    Antibiotics  :    Anti-infectives (From admission, onward)   Start     Dose/Rate Route Frequency Ordered Stop   04/10/18 1730  cefTRIAXone (ROCEPHIN) 2 g in sodium chloride 0.9 %  100 mL IVPB     2 g 200 mL/hr over 30 Minutes Intravenous Every 24 hours 04/10/18 1727     04/10/18 1730  azithromycin (ZITHROMAX) 500 mg in sodium chloride 0.9 % 250 mL IVPB  Status:  Discontinued     500 mg 250 mL/hr over 60 Minutes Intravenous Every 24 hours 04/10/18 1727 04/13/18 1336        Objective:   Vitals:   04/13/18 1955 04/13/18 2320 04/14/18 0747 04/14/18 0752  BP:  (!) 147/95  (!) 142/95  Pulse: 98 98 99 89  Resp: _0 Temp:  97.9 F (36.6 C)  98 F (36.7 C)  TempSrc:  Oral  Oral  SpO2: 95% 91% 93% 100%  Weight:      Height:        Wt Readings from Last 3 Encounters:  04/11/18 106.6 kg  08/03/13 97.7 kg  03/26/11 96.2 kg    No intake or output data in the 24 hours ending 04/14/18 1055  Physical exam Fatigued, not in distress HEENT: Moist mucosa, supple neck Chest: Persistent diffuse rhonchi bilaterally CVS: Normal S1 and S2, no murmurs GI: Soft, nondistended, nontender Musculoskeletal: Warm, no edema         Data Review:    CBC Recent Labs  Lab 04/10/18 1619 04/12/18 0400  WBC 8.8 9.3  HGB 17.0 14.2  HCT 51.6 45.2  PLT 239 225  MCV 97.0 101.1*  MCH 32.0 31.8  MCHC 32.9 31.4  RDW 13.0 13.6  LYMPHSABS 0.5*  --   MONOABS 0.2  --   EOSABS 0.2  --   BASOSABS 0.0  --     Chemistries  Recent Labs  Lab 04/10/18 1619 04/11/18 0502 04/12/18 0400  NA 127* 137 133*  K 4.3 4.1 3.7  CL 85* 99 94*  CO2 26 31 32  GLUCOSE 306* 174* 287*  BUN <5* 8 7  CREATININE 0.84  0.61 0.72  CALCIUM 9.2 8.6* 8.5*  AST 58*  --   --   ALT 47*  --   --   ALKPHOS 118  --   --   BILITOT 0.7  --   --    ------------------------------------------------------------------------------------------------------------------ No results for input(s): CHOL, HDL, LDLCALC, TRIG, CHOLHDL, LDLDIRECT in the last 72 hours.  Lab Results  Component Value Date   HGBA1C 7.7 (H) 04/11/2018    ------------------------------------------------------------------------------------------------------------------ No results for input(s): TSH, T4TOTAL, T3FREE, THYROIDAB in the last 72 hours.  Invalid input(s): FREET3 ------------------------------------------------------------------------------------------------------------------ No results for input(s): VITAMINB12, FOLATE, FERRITIN, TIBC, IRON, RETICCTPCT in the last 72 hours.  Coagulation profile No results for input(s): INR, PROTIME in the last 168 hours.  No results for input(s): DDIMER in the last 72 hours.  Cardiac Enzymes No results for input(s): CKMB, TROPONINI, MYOGLOBIN in the last 168 hours.  Invalid input(s): CK ------------------------------------------------------------------------------------------------------------------    Component Value Date/Time   BNP 191.0 (H) 04/10/2018 1930    Inpatient Medications  Scheduled Meds: . dextromethorphan-guaiFENesin  2 tablet Oral BID  . enoxaparin (LOVENOX) injection  40 mg Subcutaneous Daily  . folic acid  1 mg Oral Daily  . furosemide  40 mg Oral BID  . insulin aspart  0-15 Units Subcutaneous TID WC  . insulin aspart  4 Units Subcutaneous TID WC  . insulin glargine  7 Units Subcutaneous QHS  . ipratropium-albuterol  3 mL Nebulization TID  . multivitamin with minerals  1 tablet Oral Daily  . nicotine  21 mg Transdermal Daily  . predniSONE  40 mg Oral BID WC  . thiamine  100 mg Oral Daily   Or  . thiamine  100 mg Intravenous Daily   Continuous Infusions: . sodium chloride 10 mL/hr at 04/12/18 1740  . cefTRIAXone (ROCEPHIN)  IV 2 g (04/13/18 1702)   PRN Meds:.sodium chloride, acetaminophen **OR** acetaminophen, albuterol  Micro Results Recent Results (from the past 240 hour(s))  Blood Culture (routine x 2)     Status: None (Preliminary result)   Collection Time: 04/10/18  7:33 PM  Result Value Ref Range Status   Specimen Description BLOOD LEFT ANTECUBITAL   Final   Special Requests   Final    BOTTLES DRAWN AEROBIC AND ANAEROBIC Blood Culture adequate volume   Culture   Final    NO GROWTH 4 DAYS Performed at [facility], [place] [address], [place], [place] [address]    Report Status PENDING  Incomplete  Blood Culture (routine x 2)     Status: None (Preliminary result)   Collection Time: 04/11/18  5:03 AM  Result Value Ref Range Status   Specimen Description BLOOD RIGHT ANTECUBITAL  Final   Special Requests   Final    BOTTLES DRAWN AEROBIC ONLY Blood Culture adequate volume   Culture   Final    NO GROWTH 3 DAYS Performed at [facility], [place] [address] [place], [place] [address]    Report Status PENDING  Incomplete    Radiology Reports Dg Chest 2 View  Result Date: 04/10/2018 CLINICAL DATA:  Cough EXAM: CHEST - 2 VIEW COMPARISON:  08/09/2013 FINDINGS: Diffuse interstitial opacity suspect for interstitial inflammatory process versus interstitial edema. Normal heart size. No pleural effusion. No pneumothorax. Slight increased hilar opacity. IMPRESSION: Diffuse interstitial opacity, suspect for interstitial inflammation or edema. No focal consolidation. Increased density within the hilar regions bilaterally, possible nodes. Electronically Signed   By: Donavan Foil M.D.   On: 04/10/2018 17:55  Ct Angio Chest Pe W And/or Wo Contrast  Result Date: 04/10/2018 CLINICAL DATA:  Shortness of breath with cough EXAM: CT ANGIOGRAPHY CHEST WITH CONTRAST TECHNIQUE: Multidetector CT imaging of the chest was performed using the standard protocol during bolus administration of intravenous contrast. Multiplanar CT image reconstructions and MIPs were obtained to evaluate the vascular anatomy. CONTRAST:  75 mL ISOVUE-370 IOPAMIDOL (ISOVUE-370) INJECTION 76% COMPARISON:  Chest radiograph Ju April 10, 2018 FINDINGS: Cardiovascular: There is no demonstrable pulmonary embolus. There is no thoracic aortic aneurysm or dissection. The visualized great vessels  appear normal. There are foci of aortic atherosclerosis. There is no pericardial effusion or pericardial thickening. Prominence of the main pulmonary outflow tract is noted with a measured transverse diameter of 3.2 cm. Mediastinum/Nodes: Thyroid appears normal. There are multiple subcentimeter lymph nodes scattered throughout the mediastinum. There is a lymph node in the right hilum measuring 1.8 x 1.4 cm. There is a lymph node in the left hilum measuring 1.4 x 1.1 cm. There is a lymph node anterior to the carina measuring 1.1 x 0.9 cm. No esophageal lesions are evident. Lungs/Pleura: There are small bullae in each upper lobe. There are tree on bud type opacities in the right upper lobe consistent with pneumonia. A focal area of irregular nodular opacity in the posterior segment of the right upper lobe measures 1.7 x 1.2 cm, best seen on axial slice 44 series 8. There is atelectatic change in the right middle lobe. There is a somewhat nodular opacity abutting the pleura in the lateral segment right middle lobe measuring 1.1 x 0.8 cm. Atelectatic changes noted in the posterior left base. No pleural effusion or pleural thickening evident. Upper Abdomen: There is hepatic steatosis. Visualized upper abdominal structures otherwise appear unremarkable. Musculoskeletal: There are no blastic or lytic bone lesions. No chest wall lesions are evident. Review of the MIP images confirms the above findings. IMPRESSION: 1. No demonstrable pulmonary embolus. No thoracic aortic aneurysm or dissection. There are foci of aortic atherosclerosis. 2. Prominence the main pulmonary outflow tract, a finding felt to be indicative of a degree of pulmonary arterial hypertension. 3. There is patchy tree on bud type appearance in the right upper lobe, felt to represent pneumonia in this area. There are areas of patchy atelectasis bilaterally. 4. There is a somewhat nodular appearing opacity in the posterior segment of the right upper lobe  measuring 1.7 x 1.2 cm. A second nodular opacity in the right middle lobe laterally measures 1.1 x 0.8 cm. While these areas may represent foci pneumonia or atelectasis, neoplastic etiology in these areas cannot be excluded. It may be prudent to consider nuclear medicine PET study given these findings. At a minimum, a follow-up chest CT in 4-6 weeks to assess for potential clearing of areas of infiltrate may be advisable. 5. Several prominent lymph nodes noted. Etiology for adenopathy uncertain. Again, nuclear medicine PET study could be helpful to assess for abnormal metabolic activity in these lymph nodes which would heightened suspicion for potential pneumonia. 6.  Hepatic steatosis. Aortic Atherosclerosis (ICD10-I70.0). Electronically Signed   By: Lowella Grip III M.D.   On: 04/10/2018 20:30    Time Spent in minutes  35   Kaiah Hosea M.D on 04/14/2018 at 10:55 AM  Between 7am to 7pm - Pager - [phone]  After 7pm go to [email or web address] - password [email or web address]  [facility] -  Office  [phone]

## 2018-04-15 DIAGNOSIS — R000 Tachycardia, unspecified: Secondary | ICD-10-CM

## 2018-04-15 LAB — BASIC METABOLIC PANEL
Anion gap: 13 (ref 5–15)
BUN: 8 mg/dL (ref 6–20)
CALCIUM: 9.6 mg/dL (ref 8.9–10.3)
CO2: 30 mmol/L (ref 22–32)
Chloride: 93 mmol/L — ABNORMAL LOW (ref 98–111)
Creatinine, Ser: 0.65 mg/dL (ref 0.61–1.24)
GFR calc Af Amer: >60 mL/min (ref >60)
GFR calc non Af Amer: >60 mL/min (ref >60)
Glucose, Bld: 193 mg/dL — ABNORMAL HIGH (ref 70–99)
Potassium: 4.2 mmol/L (ref 3.5–5.1)
Sodium: 136 mmol/L (ref 135–145)

## 2018-04-15 LAB — GLUCOSE, CAPILLARY
Glucose-Capillary: 212 mg/dL — ABNORMAL HIGH (ref 70–99)
Glucose-Capillary: 215 mg/dL — ABNORMAL HIGH (ref 70–99)
Glucose-Capillary: 304 mg/dL — ABNORMAL HIGH (ref 70–99)
Glucose-Capillary: 304 mg/dL — ABNORMAL HIGH (ref 70–99)

## 2018-04-15 LAB — CULTURE, BLOOD (ROUTINE X 2)
Culture: NO GROWTH
Special Requests: ADEQUATE

## 2018-04-15 MED ORDER — METOPROLOL TARTRATE 25 MG PO TABS
25.0000 mg | ORAL_TABLET | Freq: Two times a day (BID) | ORAL | Status: DC
  Administered 2018-04-15 – 2018-04-16 (×3): 25 mg via ORAL
  Filled 2018-04-15 (×3): qty 1

## 2018-04-15 NOTE — Progress Notes (Signed)
PROGRESS NOTE                                                                                                                                                                                                             Patient Demographics:    Alex Martinez, is a 53 y.o. male, DOB - Sep 15, 1965, DEK:[id number]  Admit date - 04/10/2018   Admitting Physician Shela Leff, MD  Outpatient Primary MD for the patient is Leonard Downing, MD  LOS - 4  Outpatient Specialists: None  Chief Complaint  Patient presents with  . Pneumonia       Brief Narrative   53 year old male with history of tobacco use, alcohol use, prediabetes sent from PCP office for evaluation of possible pneumonia.  He was sent on 2 L O2 via nasal cannula.  Patient reported 3-4 days of increasing shortness of breath with productive clear sputum without any fevers, chills.  Reported decreased p.o. intake.  He reports being told of having prediabetes. Patient found to be septic secondary to lobar pneumonia along with acute hypoxic respiratory failure.   Subjective:   Reports his breathing getting slowly better.  Still tachycardic to 120s on the monitor.   Assessment  & Plan :    Principal Problem:  Acute respiratory failure with hypoxia (HCC) Secondary to lobar pneumonia and possibly COPD (newly diagnosed) with exacerbation. Patient septic on presentation, now resolved. Continue Rocephin, will discontinue after dose today (complete 5 days). Resume prednisone, continue scheduled DuoNeb every 4 hours, PRN albuterol neb and antitussive. Wheezing and shortness of breath better today with addition of Lasix.  Patient likely has underlying COPD, reports cough with whitish phlegm for several months and dyspnea on exertion from the same duration which has worsened 1 week prior to this hospitalization. CT angiogram negative for PE but shows right upper  lobe pneumonia. 2D echo shows EF of 60-65% with no wall motion abnormality and grade 2 diastolic dysfunction.   Patient  noted to desat to 83% on room air both at rest and ambulation.  Improved on 2 L via nasal cannula to 93%.  Will reassess for home O2 need when his getting closer to discharge.  active Problems: Sinus tachycardia No signs of withdrawal.  Blood pressure elevated as well.  Added low-dose metoprolol.  He likely has underlying hypertension as well.  Pulmonary nodules Nodular opacity in the posterior right upper lobe, right middle lobe.  Cannot exclude neoplastic etiology.  Recommends either a PET scan or at least follow-up chest CT in 4-6 weeks .  Tobacco abuse Smokes almost 2 packs/day.  Counseled strongly on cessation.  Nicotine patch.  Diabetes mellitus type 2 A1c of 7.7.  Reported history of prediabetes.  CBG elevated.  CBG stable with low-dose pre-meal aspart and low-dose Lantus at bedtime.  Diabetic coordinator consulted.  Will start on oral hypoglycemic prior to discharge.  Alcohol abuse Counseled strongly on cessation.  Does get tachycardic on minimal exertion but no signs of acute withdrawal.    Continue with PRN Ativan, thiamine folate and multivitamin.  Hyponatremia  Possibly due to dehydration with sepsis.  Now resolved with fluids.  Code Status : Full code  Family Communication  : None at bedside  Disposition Plan  : Home possibly tomorrow if shortness of breath and tachycardia improved.  Will reassess need for home O2 prior to discharge.  Barriers For Discharge : Active symptoms  Consults  : None  Procedures  : 2D echo, CT angiogram of the chest  DVT Prophylaxis  :  Lovenox -   Lab Results  Component Value Date   PLT 225 04/12/2018    Antibiotics  :    Anti-infectives (From admission, onward)   Start     Dose/Rate Route Frequency Ordered Stop   04/10/18 1730  cefTRIAXone (ROCEPHIN) 2 g in sodium chloride 0.9 % 100 mL IVPB     2 g 200  mL/hr over 30 Minutes Intravenous Every 24 hours 04/10/18 1727     04/10/18 1730  azithromycin (ZITHROMAX) 500 mg in sodium chloride 0.9 % 250 mL IVPB  Status:  Discontinued     500 mg 250 mL/hr over 60 Minutes Intravenous Every 24 hours 04/10/18 1727 04/13/18 1336        Objective:   Vitals:   04/14/18 2008 04/15/18 0000 04/15/18 0754 04/15/18 0831  BP:  (!) 152/94  (!) 129/95  Pulse: 90 85  (!) 105  Resp: 20 20    Temp:  98.4 F (36.9 C)  98.2 F (36.8 C)  TempSrc:  Oral  Oral  SpO2: 98% 92% 95% 92%  Weight:      Height:        Wt Readings from Last 3 Encounters:  04/11/18 106.6 kg  08/03/13 97.7 kg  03/26/11 96.2 kg     Intake/Output Summary (Last 24 hours) at 04/15/2018 1110 Last data filed at 04/14/2018 2100 Gross per 24 hour  Intake 240 ml  Output 250 ml  Net -10 ml    Physical exam Not in distress HEENT:moist mucosa Chest: Few scattered rhonchi (improved from yesterday) CVS: S1-S2 tachycardic, no murmurs GI: Soft, nondistended, nontender Musculoskeletal: Warm, no edema          Data Review:    CBC Recent Labs  Lab 04/10/18 1619 04/12/18 0400  WBC 8.8 9.3  HGB 17.0 14.2  HCT 51.6 45.2  PLT 239 225  MCV 97.0 101.1*  MCH 32.0 31.8  MCHC 32.9 31.4  RDW 13.0 13.6  LYMPHSABS 0.5*  --   MONOABS 0.2  --   EOSABS 0.2  --   BASOSABS 0.0  --     Chemistries  Recent Labs  Lab 04/10/18 1619 04/11/18 0502 04/12/18 0400 04/15/18 0755  NA 127* 137 133* 136  K 4.3 4.1 3.7 4.2  CL 85* 99 94* 93*  CO2 26  31 32 30  GLUCOSE 306* 174* 287* 193*  BUN <5* _0 CREATININE 0.84 0.61 0.72 0.65  CALCIUM 9.2 8.6* 8.5* 9.6  AST 58*  --   --   --   ALT 47*  --   --   --   ALKPHOS 118  --   --   --   BILITOT 0.7  --   --   --    ------------------------------------------------------------------------------------------------------------------ No results for input(s): CHOL, HDL, LDLCALC, TRIG, CHOLHDL, LDLDIRECT in the last 72 hours.  Lab Results   Component Value Date   HGBA1C 7.7 (H) 04/11/2018   ------------------------------------------------------------------------------------------------------------------ No results for input(s): TSH, T4TOTAL, T3FREE, THYROIDAB in the last 72 hours.  Invalid input(s): FREET3 ------------------------------------------------------------------------------------------------------------------ No results for input(s): VITAMINB12, FOLATE, FERRITIN, TIBC, IRON, RETICCTPCT in the last 72 hours.  Coagulation profile No results for input(s): INR, PROTIME in the last 168 hours.  No results for input(s): DDIMER in the last 72 hours.  Cardiac Enzymes No results for input(s): CKMB, TROPONINI, MYOGLOBIN in the last 168 hours.  Invalid input(s): CK ------------------------------------------------------------------------------------------------------------------    Component Value Date/Time   BNP 191.0 (H) 04/10/2018 1930    Inpatient Medications  Scheduled Meds: . dextromethorphan-guaiFENesin  2 tablet Oral BID  . enoxaparin (LOVENOX) injection  40 mg Subcutaneous Daily  . folic acid  1 mg Oral Daily  . furosemide  40 mg Oral BID  . insulin aspart  0-15 Units Subcutaneous TID WC  . insulin aspart  4 Units Subcutaneous TID WC  . insulin glargine  7 Units Subcutaneous QHS  . ipratropium-albuterol  3 mL Nebulization TID  . metoprolol tartrate  25 mg Oral BID  . multivitamin with minerals  1 tablet Oral Daily  . nicotine  21 mg Transdermal Daily  . predniSONE  40 mg Oral BID WC  . thiamine  100 mg Oral Daily   Or  . thiamine  100 mg Intravenous Daily   Continuous Infusions: . sodium chloride 10 mL/hr at 04/12/18 1740  . cefTRIAXone (ROCEPHIN)  IV 2 g (04/14/18 2042)   PRN Meds:.sodium chloride, acetaminophen **OR** acetaminophen, albuterol  Micro Results Recent Results (from the past 240 hour(s))  Blood Culture (routine x 2)     Status: None   Collection Time: 04/10/18  7:33 PM  Result  Value Ref Range Status   Specimen Description BLOOD LEFT ANTECUBITAL  Final   Special Requests   Final    BOTTLES DRAWN AEROBIC AND ANAEROBIC Blood Culture adequate volume   Culture   Final    NO GROWTH 5 DAYS Performed at [facility], [place] [address], [place], [place] [address]    Report Status 04/15/2018 FINAL  Final  Blood Culture (routine x 2)     Status: None (Preliminary result)   Collection Time: 04/11/18  5:03 AM  Result Value Ref Range Status   Specimen Description BLOOD RIGHT ANTECUBITAL  Final   Special Requests   Final    BOTTLES DRAWN AEROBIC ONLY Blood Culture adequate volume   Culture   Final    NO GROWTH 4 DAYS Performed at [facility], [place] [address], [place], [place] [address]    Report Status PENDING  Incomplete    Radiology Reports Dg Chest 2 View  Result Date: 04/10/2018 CLINICAL DATA:  Cough EXAM: CHEST - 2 VIEW COMPARISON:  08/09/2013 FINDINGS: Diffuse interstitial opacity suspect for interstitial inflammatory process versus interstitial edema. Normal heart size. No pleural effusion. No pneumothorax.  Slight increased hilar opacity. IMPRESSION: Diffuse interstitial opacity, suspect for interstitial inflammation or edema. No focal consolidation. Increased density within the hilar regions bilaterally, possible nodes. Electronically Signed   By: Donavan Foil M.D.   On: 04/10/2018 17:55   Ct Angio Chest Pe W And/or Wo Contrast  Result Date: 04/10/2018 CLINICAL DATA:  Shortness of breath with cough EXAM: CT ANGIOGRAPHY CHEST WITH CONTRAST TECHNIQUE: Multidetector CT imaging of the chest was performed using the standard protocol during bolus administration of intravenous contrast. Multiplanar CT image reconstructions and MIPs were obtained to evaluate the vascular anatomy. CONTRAST:  75 mL ISOVUE-370 IOPAMIDOL (ISOVUE-370) INJECTION 76% COMPARISON:  Chest radiograph Ju April 10, 2018 FINDINGS: Cardiovascular: There is no demonstrable pulmonary embolus. There  is no thoracic aortic aneurysm or dissection. The visualized great vessels appear normal. There are foci of aortic atherosclerosis. There is no pericardial effusion or pericardial thickening. Prominence of the main pulmonary outflow tract is noted with a measured transverse diameter of 3.2 cm. Mediastinum/Nodes: Thyroid appears normal. There are multiple subcentimeter lymph nodes scattered throughout the mediastinum. There is a lymph node in the right hilum measuring 1.8 x 1.4 cm. There is a lymph node in the left hilum measuring 1.4 x 1.1 cm. There is a lymph node anterior to the carina measuring 1.1 x 0.9 cm. No esophageal lesions are evident. Lungs/Pleura: There are small bullae in each upper lobe. There are tree on bud type opacities in the right upper lobe consistent with pneumonia. A focal area of irregular nodular opacity in the posterior segment of the right upper lobe measures 1.7 x 1.2 cm, best seen on axial slice 44 series 8. There is atelectatic change in the right middle lobe. There is a somewhat nodular opacity abutting the pleura in the lateral segment right middle lobe measuring 1.1 x 0.8 cm. Atelectatic changes noted in the posterior left base. No pleural effusion or pleural thickening evident. Upper Abdomen: There is hepatic steatosis. Visualized upper abdominal structures otherwise appear unremarkable. Musculoskeletal: There are no blastic or lytic bone lesions. No chest wall lesions are evident. Review of the MIP images confirms the above findings. IMPRESSION: 1. No demonstrable pulmonary embolus. No thoracic aortic aneurysm or dissection. There are foci of aortic atherosclerosis. 2. Prominence the main pulmonary outflow tract, a finding felt to be indicative of a degree of pulmonary arterial hypertension. 3. There is patchy tree on bud type appearance in the right upper lobe, felt to represent pneumonia in this area. There are areas of patchy atelectasis bilaterally. 4. There is a somewhat  nodular appearing opacity in the posterior segment of the right upper lobe measuring 1.7 x 1.2 cm. A second nodular opacity in the right middle lobe laterally measures 1.1 x 0.8 cm. While these areas may represent foci pneumonia or atelectasis, neoplastic etiology in these areas cannot be excluded. It may be prudent to consider nuclear medicine PET study given these findings. At a minimum, a follow-up chest CT in 4-6 weeks to assess for potential clearing of areas of infiltrate may be advisable. 5. Several prominent lymph nodes noted. Etiology for adenopathy uncertain. Again, nuclear medicine PET study could be helpful to assess for abnormal metabolic activity in these lymph nodes which would heightened suspicion for potential pneumonia. 6.  Hepatic steatosis. Aortic Atherosclerosis (ICD10-I70.0). Electronically Signed   By: Lowella Grip III M.D.   On: 04/10/2018 20:30    Time Spent in minutes  35   Nabilah Davoli M.D on 04/15/2018 at 11:10 AM  Between 7am to 7pm - Pager - [phone]  After 7pm go to [email or web address] - password [email or web address]  [facility] -  Office  [phone]

## 2018-04-16 DIAGNOSIS — E1165 Type 2 diabetes mellitus with hyperglycemia: Secondary | ICD-10-CM

## 2018-04-16 LAB — GLUCOSE, CAPILLARY
Glucose-Capillary: 163 mg/dL — ABNORMAL HIGH (ref 70–99)
Glucose-Capillary: 291 mg/dL — ABNORMAL HIGH (ref 70–99)
Glucose-Capillary: 352 mg/dL — ABNORMAL HIGH (ref 70–99)
Glucose-Capillary: 381 mg/dL — ABNORMAL HIGH (ref 70–99)

## 2018-04-16 LAB — TSH: TSH: 2.098 u[IU]/mL (ref 0.350–4.500)

## 2018-04-16 LAB — CULTURE, BLOOD (ROUTINE X 2)
CULTURE: NO GROWTH
Special Requests: ADEQUATE

## 2018-04-16 MED ORDER — METOPROLOL TARTRATE 50 MG PO TABS
50.0000 mg | ORAL_TABLET | Freq: Two times a day (BID) | ORAL | Status: DC
  Administered 2018-04-16 – 2018-04-17 (×2): 50 mg via ORAL
  Filled 2018-04-16 (×3): qty 1

## 2018-04-16 MED ORDER — LEVALBUTEROL HCL 0.63 MG/3ML IN NEBU
0.6300 mg | INHALATION_SOLUTION | Freq: Three times a day (TID) | RESPIRATORY_TRACT | Status: DC
  Administered 2018-04-16 – 2018-04-17 (×2): 0.63 mg via RESPIRATORY_TRACT
  Filled 2018-04-16: qty 3

## 2018-04-16 MED ORDER — LEVALBUTEROL HCL 0.63 MG/3ML IN NEBU
0.6300 mg | INHALATION_SOLUTION | Freq: Three times a day (TID) | RESPIRATORY_TRACT | Status: DC
  Administered 2018-04-16: 0.63 mg via RESPIRATORY_TRACT
  Filled 2018-04-16 (×2): qty 3

## 2018-04-16 MED ORDER — LEVALBUTEROL HCL 0.63 MG/3ML IN NEBU: 0.6300 mg | INHALATION_SOLUTION | Freq: Four times a day (QID) | RESPIRATORY_TRACT | Status: DC | PRN

## 2018-04-16 MED ORDER — INSULIN ASPART 100 UNIT/ML ~~LOC~~ SOLN
6.0000 [IU] | Freq: Three times a day (TID) | SUBCUTANEOUS | Status: DC
  Administered 2018-04-16 – 2018-04-17 (×3): 6 [IU] via SUBCUTANEOUS

## 2018-04-16 MED ORDER — INSULIN GLARGINE 100 UNIT/ML ~~LOC~~ SOLN
12.0000 [IU] | Freq: Every day | SUBCUTANEOUS | Status: DC
  Administered 2018-04-16: 12 [IU] via SUBCUTANEOUS
  Filled 2018-04-16: qty 0.12

## 2018-04-16 NOTE — Progress Notes (Signed)
PROGRESS NOTE                                                                                                                                                                                                             Patient Demographics:    Alex Martinez, is a 53 y.o. male, DOB - 13-Sep-1965, FMB:[id number]  Admit date - 04/10/2018   Admitting Physician Shela Leff, MD  Outpatient Primary MD for the patient is Leonard Downing, MD  LOS - 5  Outpatient Specialists: None  Chief Complaint  Patient presents with  . Pneumonia       Brief Narrative   54 year old male with history of tobacco use, alcohol use, prediabetes sent from PCP office for evaluation of possible pneumonia.  He was sent on 2 L O2 via nasal cannula.  Patient reported 3-4 days of increasing shortness of breath with productive clear sputum without any fevers, chills.  Reported decreased p.o. intake.  He reports being told of having prediabetes. Patient found to be septic secondary to lobar pneumonia along with acute hypoxic respiratory failure.   Subjective:   Breathing slightly better from yesterday however patient is tachycardic to 130s on the monitor.  Still having some cough.   Assessment  & Plan :    Principal Problem:  Acute respiratory failure with hypoxia (HCC) Secondary to lobar pneumonia and possibly COPD (newly diagnosed) with exacerbation. Patient septic on presentation, now resolved. Received 6 days of IV Rocephin.  Continue oral prednisone.  Will switch DuoNeb to Xopenex neb given persistent tachycardia.  Also received 4 doses of IV Lasix.  Continue antitussives.   Patient likely has underlying COPD, reports cough with whitish phlegm for several months and dyspnea on exertion from the same duration which has worsened 1 week prior to this hospitalization. CT angiogram negative for PE but shows right upper lobe pneumonia. 2D  echo shows EF of 60-65% with no wall motion abnormality and grade 2 diastolic dysfunction.   Patient  noted to desat to 83% on room air both at rest and ambulation.  Improved on 2 L via nasal cannula to 93%.  Will reassess for home O2 need when his getting closer to discharge.  active Problems: Sinus tachycardia No signs of withdrawal.  Elevated blood pressure.  Started on metoprolol and dose increased today..  Normal TSH.  Switch albuterol to Xopenex and monitor.    Pulmonary nodules Nodular opacity in the posterior right upper lobe, right middle lobe.  Cannot exclude neoplastic etiology.  Recommends either a PET scan or at least follow-up chest CT in 4-6 weeks .  Tobacco abuse Smokes almost 2 packs/day.  Counseled strongly on cessation.  Nicotine patch.  Diabetes mellitus type 2 A1c of 7.7.  Reported history of prediabetes.  CBG still elevated.  Will increase dose of Lantus and pre-meal aspart.  Needs oral hypoglycemic upon discharge.  Alcohol abuse Counseled strongly on cessation.  Does get tachycardic on minimal exertion but no signs of acute withdrawal.    Continue with PRN Ativan, thiamine folate and multivitamin.  Hyponatremia  Possibly due to dehydration with sepsis.  Now resolved with fluids.  Code Status : Full code  Family Communication  : None at bedside  Disposition Plan  : Home tomorrow if tachycardia resolves, breathing continues to improve and CBG better controlled.  Barriers For Discharge : Active symptoms  Consults  : None  Procedures  : 2D echo, CT angiogram of the chest  DVT Prophylaxis  :  Lovenox -   Lab Results  Component Value Date   PLT 225 04/12/2018    Antibiotics  :    Anti-infectives (From admission, onward)   Start     Dose/Rate Route Frequency Ordered Stop   04/10/18 1730  cefTRIAXone (ROCEPHIN) 2 g in sodium chloride 0.9 % 100 mL IVPB  Status:  Discontinued     2 g 200 mL/hr over 30 Minutes Intravenous Every 24 hours 04/10/18 1727  04/16/18 0838   04/10/18 1730  azithromycin (ZITHROMAX) 500 mg in sodium chloride 0.9 % 250 mL IVPB  Status:  Discontinued     500 mg 250 mL/hr over 60 Minutes Intravenous Every 24 hours 04/10/18 1727 04/13/18 1336        Objective:   Vitals:   04/15/18 1947 04/15/18 2141 04/15/18 2353 04/16/18 0813  BP:  (!) 136/91 130/86 (!) 139/98  Pulse:  (!) 102 84 84  Resp:   20   Temp:   98.3 F (36.8 C) 97.6 F (36.4 C)  TempSrc:   Oral Oral  SpO2: 93%  95% 99%  Weight:      Height:        Wt Readings from Last 3 Encounters:  04/11/18 106.6 kg  08/03/13 97.7 kg  03/26/11 96.2 kg     Intake/Output Summary (Last 24 hours) at 04/16/2018 1114 Last data filed at 04/16/2018 1001 Gross per 24 hour  Intake 1050 ml  Output -  Net 1050 ml   Physical exam Not in distress, no tremors HEENT: Moist mucosa, supple neck Chest: Few scattered rhonchi (further improved from yesterday) CVS: S1 and S2 tachycardic, no murmurs GI: Soft, nondistended, nontender Musculoskeletal: Warm, no edema          Data Review:    CBC Recent Labs  Lab 04/10/18 1619 04/12/18 0400  WBC 8.8 9.3  HGB 17.0 14.2  HCT 51.6 45.2  PLT 239 225  MCV 97.0 101.1*  MCH 32.0 31.8  MCHC 32.9 31.4  RDW 13.0 13.6  LYMPHSABS 0.5*  --   MONOABS 0.2  --   EOSABS 0.2  --   BASOSABS 0.0  --     Chemistries  Recent Labs  Lab 04/10/18 1619 04/11/18 0502 04/12/18 0400 04/15/18 0755  NA 127* 137 133* 136  K 4.3 4.1 3.7 4.2  CL 85* 99 94* 93*  CO2 26 31 32 30  GLUCOSE 306* 174* 287* 193*  BUN <5* _0 CREATININE 0.84 0.61 0.72 0.65  CALCIUM 9.2 8.6* 8.5* 9.6  AST 58*  --   --   --   ALT 47*  --   --   --   ALKPHOS 118  --   --   --   BILITOT 0.7  --   --   --    ------------------------------------------------------------------------------------------------------------------ No results for input(s): CHOL, HDL, LDLCALC, TRIG, CHOLHDL, LDLDIRECT in the last 72 hours.  Lab Results  Component  Value Date   HGBA1C 7.7 (H) 04/11/2018   ------------------------------------------------------------------------------------------------------------------ Recent Labs    04/16/18 0904  TSH 2.098   ------------------------------------------------------------------------------------------------------------------ No results for input(s): VITAMINB12, FOLATE, FERRITIN, TIBC, IRON, RETICCTPCT in the last 72 hours.  Coagulation profile No results for input(s): INR, PROTIME in the last 168 hours.  No results for input(s): DDIMER in the last 72 hours.  Cardiac Enzymes No results for input(s): CKMB, TROPONINI, MYOGLOBIN in the last 168 hours.  Invalid input(s): CK ------------------------------------------------------------------------------------------------------------------    Component Value Date/Time   BNP 191.0 (H) 04/10/2018 1930    Inpatient Medications  Scheduled Meds: . dextromethorphan-guaiFENesin  2 tablet Oral BID  . enoxaparin (LOVENOX) injection  40 mg Subcutaneous Daily  . folic acid  1 mg Oral Daily  . insulin aspart  0-15 Units Subcutaneous TID WC  . insulin aspart  4 Units Subcutaneous TID WC  . insulin glargine  7 Units Subcutaneous QHS  . levalbuterol  0.63 mg Nebulization Q8H  . metoprolol tartrate  50 mg Oral BID  . multivitamin with minerals  1 tablet Oral Daily  . nicotine  21 mg Transdermal Daily  . predniSONE  40 mg Oral BID WC  . thiamine  100 mg Oral Daily   Or  . thiamine  100 mg Intravenous Daily   Continuous Infusions: . sodium chloride 10 mL/hr at 04/12/18 1740   PRN Meds:.sodium chloride, acetaminophen **OR** acetaminophen  Micro Results Recent Results (from the past 240 hour(s))  Blood Culture (routine x 2)     Status: None   Collection Time: 04/10/18  7:33 PM  Result Value Ref Range Status   Specimen Description BLOOD LEFT ANTECUBITAL  Final   Special Requests   Final    BOTTLES DRAWN AEROBIC AND ANAEROBIC Blood Culture adequate  volume   Culture   Final    NO GROWTH 5 DAYS Performed at [facility], [address] [place], [place] [address]    Report Status 04/15/2018 FINAL  Final  Blood Culture (routine x 2)     Status: None   Collection Time: 04/11/18  5:03 AM  Result Value Ref Range Status   Specimen Description BLOOD RIGHT ANTECUBITAL  Final   Special Requests   Final    BOTTLES DRAWN AEROBIC ONLY Blood Culture adequate volume   Culture   Final    NO GROWTH 5 DAYS Performed at [facility], [place] [address], [place], [place] [address]    Report Status 04/16/2018 FINAL  Final    Radiology Reports Dg Chest 2 View  Result Date: 04/10/2018 CLINICAL DATA:  Cough EXAM: CHEST - 2 VIEW COMPARISON:  08/09/2013 FINDINGS: Diffuse interstitial opacity suspect for interstitial inflammatory process versus interstitial edema. Normal heart size. No pleural effusion. No pneumothorax. Slight increased hilar opacity. IMPRESSION: Diffuse interstitial opacity, suspect for interstitial inflammation or edema. No focal consolidation. Increased density within the hilar regions  bilaterally, possible nodes. Electronically Signed   By: Donavan Foil M.D.   On: 04/10/2018 17:55   Ct Angio Chest Pe W And/or Wo Contrast  Result Date: 04/10/2018 CLINICAL DATA:  Shortness of breath with cough EXAM: CT ANGIOGRAPHY CHEST WITH CONTRAST TECHNIQUE: Multidetector CT imaging of the chest was performed using the standard protocol during bolus administration of intravenous contrast. Multiplanar CT image reconstructions and MIPs were obtained to evaluate the vascular anatomy. CONTRAST:  75 mL ISOVUE-370 IOPAMIDOL (ISOVUE-370) INJECTION 76% COMPARISON:  Chest radiograph Ju April 10, 2018 FINDINGS: Cardiovascular: There is no demonstrable pulmonary embolus. There is no thoracic aortic aneurysm or dissection. The visualized great vessels appear normal. There are foci of aortic atherosclerosis. There is no pericardial effusion or pericardial  thickening. Prominence of the main pulmonary outflow tract is noted with a measured transverse diameter of 3.2 cm. Mediastinum/Nodes: Thyroid appears normal. There are multiple subcentimeter lymph nodes scattered throughout the mediastinum. There is a lymph node in the right hilum measuring 1.8 x 1.4 cm. There is a lymph node in the left hilum measuring 1.4 x 1.1 cm. There is a lymph node anterior to the carina measuring 1.1 x 0.9 cm. No esophageal lesions are evident. Lungs/Pleura: There are small bullae in each upper lobe. There are tree on bud type opacities in the right upper lobe consistent with pneumonia. A focal area of irregular nodular opacity in the posterior segment of the right upper lobe measures 1.7 x 1.2 cm, best seen on axial slice 44 series 8. There is atelectatic change in the right middle lobe. There is a somewhat nodular opacity abutting the pleura in the lateral segment right middle lobe measuring 1.1 x 0.8 cm. Atelectatic changes noted in the posterior left base. No pleural effusion or pleural thickening evident. Upper Abdomen: There is hepatic steatosis. Visualized upper abdominal structures otherwise appear unremarkable. Musculoskeletal: There are no blastic or lytic bone lesions. No chest wall lesions are evident. Review of the MIP images confirms the above findings. IMPRESSION: 1. No demonstrable pulmonary embolus. No thoracic aortic aneurysm or dissection. There are foci of aortic atherosclerosis. 2. Prominence the main pulmonary outflow tract, a finding felt to be indicative of a degree of pulmonary arterial hypertension. 3. There is patchy tree on bud type appearance in the right upper lobe, felt to represent pneumonia in this area. There are areas of patchy atelectasis bilaterally. 4. There is a somewhat nodular appearing opacity in the posterior segment of the right upper lobe measuring 1.7 x 1.2 cm. A second nodular opacity in the right middle lobe laterally measures 1.1 x 0.8 cm.  While these areas may represent foci pneumonia or atelectasis, neoplastic etiology in these areas cannot be excluded. It may be prudent to consider nuclear medicine PET study given these findings. At a minimum, a follow-up chest CT in 4-6 weeks to assess for potential clearing of areas of infiltrate may be advisable. 5. Several prominent lymph nodes noted. Etiology for adenopathy uncertain. Again, nuclear medicine PET study could be helpful to assess for abnormal metabolic activity in these lymph nodes which would heightened suspicion for potential pneumonia. 6.  Hepatic steatosis. Aortic Atherosclerosis (ICD10-I70.0). Electronically Signed   By: Lowella Grip III M.D.   On: 04/10/2018 20:30    Time Spent in minutes  35   Sereen Schaff M.D on 04/16/2018 at 11:14 AM  Between 7am to 7pm - Pager - [phone]  After 7pm go to [email or web address] - password [email or web address]  [facility] -  Office  [phone]

## 2018-04-17 DIAGNOSIS — E1165 Type 2 diabetes mellitus with hyperglycemia: Secondary | ICD-10-CM | POA: Diagnosis present

## 2018-04-17 DIAGNOSIS — R591 Generalized enlarged lymph nodes: Secondary | ICD-10-CM

## 2018-04-17 DIAGNOSIS — J181 Lobar pneumonia, unspecified organism: Secondary | ICD-10-CM

## 2018-04-17 DIAGNOSIS — J189 Pneumonia, unspecified organism: Secondary | ICD-10-CM

## 2018-04-17 DIAGNOSIS — A419 Sepsis, unspecified organism: Secondary | ICD-10-CM

## 2018-04-17 DIAGNOSIS — J9601 Acute respiratory failure with hypoxia: Secondary | ICD-10-CM

## 2018-04-17 DIAGNOSIS — E1159 Type 2 diabetes mellitus with other circulatory complications: Secondary | ICD-10-CM | POA: Diagnosis present

## 2018-04-17 DIAGNOSIS — R000 Tachycardia, unspecified: Secondary | ICD-10-CM | POA: Diagnosis present

## 2018-04-17 DIAGNOSIS — I152 Hypertension secondary to endocrine disorders: Secondary | ICD-10-CM | POA: Diagnosis present

## 2018-04-17 DIAGNOSIS — I10 Essential (primary) hypertension: Secondary | ICD-10-CM

## 2018-04-17 HISTORY — DX: Sepsis, unspecified organism: A41.9

## 2018-04-17 LAB — GLUCOSE, CAPILLARY: GLUCOSE-CAPILLARY: 241 mg/dL — AB (ref 70–99)

## 2018-04-17 MED ORDER — BLOOD GLUCOSE METER KIT: PACK | 0 refills | Status: AC

## 2018-04-17 MED ORDER — METOPROLOL TARTRATE 50 MG PO TABS: 50.0000 mg | ORAL_TABLET | Freq: Two times a day (BID) | ORAL | 0 refills | Status: DC

## 2018-04-17 MED ORDER — PREDNISONE 20 MG PO TABS: 20.0000 mg | ORAL_TABLET | Freq: Every day | ORAL | 0 refills | Status: DC

## 2018-04-17 MED ORDER — METFORMIN HCL 500 MG PO TABS: 500.0000 mg | ORAL_TABLET | Freq: Two times a day (BID) | ORAL | 0 refills | Status: DC

## 2018-04-17 MED ORDER — ALBUTEROL SULFATE HFA 108 (90 BASE) MCG/ACT IN AERS: 2.0000 | INHALATION_SPRAY | Freq: Four times a day (QID) | RESPIRATORY_TRACT | 2 refills | Status: DC | PRN

## 2018-04-17 MED ORDER — GUAIFENESIN-DM 100-10 MG/5ML PO SYRP: 5.0000 mL | ORAL_SOLUTION | ORAL | 0 refills | Status: AC | PRN

## 2018-04-17 NOTE — Progress Notes (Signed)
SATURATION QUALIFICATIONS: (This note is used to comply with regulatory documentation for home oxygen)  Patient Saturations on Room Air at Rest = 95%  Patient Saturations on Room Air while Ambulating = 92%  Patient Saturations on  Liters of oxygen while Ambulating = %  Please briefly explain why patient needs home oxygen:

## 2018-04-17 NOTE — Discharge Instructions (Signed)
Blood Glucose Monitoring, Adult °Monitoring your blood sugar (glucose) is an important part of managing your diabetes (diabetes mellitus). Blood glucose monitoring involves checking your blood glucose as often as directed and keeping a record (log) of your results over time. °Checking your blood glucose regularly and keeping a blood glucose log can: °· Help you and your health care provider adjust your diabetes management plan as needed, including your medicines or insulin. °· Help you understand how food, exercise, illnesses, and medicines affect your blood glucose. °· Let you know what your blood glucose is at any time. You can quickly find out if you have low blood glucose (hypoglycemia) or high blood glucose (hyperglycemia). °Your health care provider will set individualized treatment goals for you. Your goals will be based on your age, other medical conditions you have, and how you respond to diabetes treatment. Generally, the goal of treatment is to maintain the following blood glucose levels: °· Before meals (preprandial): 80-130 mg/dL (4.4-7.2 mmol/L). °· After meals (postprandial): below 180 mg/dL (10 mmol/L). °· A1c level: less than 7%. °Supplies needed: °· Blood glucose meter. °· Test strips for your meter. Each meter has its own strips. You must use the strips that came with your meter. °· A needle to prick your finger (lancet). Do not use a lancet more than one time. °· A device that holds the lancet (lancing device). °· A journal or log book to write down your results. °How to check your blood glucose ° °1. Wash your hands with soap and water. °2. Prick the side of your finger (not the tip) with the lancet. Use a different finger each time. °3. Gently rub the finger until a small drop of blood appears. °4. Follow instructions that come with your meter for inserting the test strip, applying blood to the strip, and using your blood glucose meter. °5. Write down your result and any notes. °Some meters  allow you to use areas of your body other than your finger (alternative sites) to test your blood. The most common alternative sites are: °· Forearm. °· Thigh. °· Palm of the hand. °If you think you may have hypoglycemia, or if you have a history of not knowing when your blood glucose is getting low (hypoglycemia unawareness), do not use alternative sites. Use your finger instead. Alternative sites may not be as accurate as the fingers, because blood flow is slower in these areas. This means that the result you get may be delayed, and it may be different from the result that you would get from your finger. °Follow these instructions at home: °Blood glucose log ° °· Every time you check your blood glucose, write down your result. Also write down any notes about things that may be affecting your blood glucose, such as your diet and exercise for the day. This information can help you and your health care provider: °? Look for patterns in your blood glucose over time. °? Adjust your diabetes management plan as needed. °· Check if your meter allows you to download your records to a computer. Most glucose meters store a record of glucose readings in the meter. °If you have type 1 diabetes: °· Check your blood glucose 2 or more times a day. °· Also check your blood glucose: °? Before every insulin injection. °? Before and after exercise. °? Before meals. °? 2 hours after a meal. °? Occasionally between 2:00 a.m. and 3:00 a.m., as directed. °? Before potentially dangerous tasks, like driving or using heavy machinery. °?   At bedtime.  You may need to check your blood glucose more often, up to 6-10 times a day, if you: ? Use an insulin pump. ? Need multiple daily injections (MDI). ? Have diabetes that is not well-controlled. ? Are ill. ? Have a history of severe hypoglycemia. ? Have hypoglycemia unawareness. If you have type 2 diabetes:  If you take insulin or other diabetes medicines, check your blood glucose 2 or  more times a day.  If you are on intensive insulin therapy, check your blood glucose 4 or more times a day. Occasionally, you may also need to check between 2:00 a.m. and 3:00 a.m., as directed.  Also check your blood glucose: ? Before and after exercise. ? Before potentially dangerous tasks, like driving or using heavy machinery.  You may need to check your blood glucose more often if: ? Your medicine is being adjusted. ? Your diabetes is not well-controlled. ? You are ill. General tips  Always keep your supplies with you.  If you have questions or need help, all blood glucose meters have a 24-hour "hotline" phone number that you can call. You may also contact your health care provider.  After you use a few boxes of test strips, adjust (calibrate) your blood glucose meter by following instructions that came with your meter. Contact a health care provider if:  Your blood glucose is at or above 240 mg/dL (13.3 mmol/L) for 2 days in a row.  You have been sick or have had a fever for 2 days or longer, and you are not getting better.  You have any of the following problems for more than 6 hours: ? You cannot eat or drink. ? You have nausea or vomiting. ? You have diarrhea. Get help right away if:  Your blood glucose is lower than 54 mg/dL (3 mmol/L).  You become confused or you have trouble thinking clearly.  You have difficulty breathing.  You have moderate or large ketone levels in your urine. Summary  Monitoring your blood sugar (glucose) is an important part of managing your diabetes (diabetes mellitus).  Blood glucose monitoring involves checking your blood glucose as often as directed and keeping a record (log) of your results over time.  Your health care provider will set individualized treatment goals for you. Your goals will be based on your age, other medical conditions you have, and how you respond to diabetes treatment.  Every time you check your blood glucose,  write down your result. Also write down any notes about things that may be affecting your blood glucose, such as your diet and exercise for the day. This information is not intended to replace advice given to you by your health care provider. Make sure you discuss any questions you have with your health care provider. Document Released: 03/25/2003 Document Revised: 01/31/2017 Document Reviewed: 09/01/2015 Elsevier Interactive Patient Education  2019 [employer]    Chronic Obstructive Pulmonary Disease Chronic obstructive pulmonary disease (COPD) is a long-term (chronic) lung problem. When you have COPD, it is hard for air to get in and out of your lungs. Usually the condition gets worse over time, and your lungs will never return to normal. There are things you can do to keep yourself as healthy as possible.  Your doctor may treat your condition with: ? Medicines. ? Oxygen. ? Lung surgery.  Your doctor may also recommend: ? Rehabilitation. This includes steps to make your body work better. It may involve a team of specialists. ? Quitting smoking,  if you smoke. ? Exercise and changes to your diet. ? Comfort measures (palliative care). Follow these instructions at home: Medicines  Take over-the-counter and prescription medicines only as told by your doctor.  Talk to your doctor before taking any cough or allergy medicines. You may need to avoid medicines that cause your lungs to be dry. Lifestyle  If you smoke, stop. Smoking makes the problem worse. If you need help quitting, ask your doctor.  Avoid being around things that make your breathing worse. This may include smoke, chemicals, and fumes.  Stay active, but remember to rest as well.  Learn and use tips on how to relax.  Make sure you get enough sleep. Most adults need at least 7 hours of sleep every night.  Eat healthy foods. Eat smaller meals more often. Rest before meals. Controlled breathing Learn and use tips on how  to control your breathing as told by your doctor. Try:  Breathing in (inhaling) through your nose for 1 second. Then, pucker your lips and breath out (exhale) through your lips for 2 seconds.  Putting one hand on your belly (abdomen). Breathe in slowly through your nose for 1 second. Your hand on your belly should move out. Pucker your lips and breathe out slowly through your lips. Your hand on your belly should move in as you breathe out.  Controlled coughing Learn and use controlled coughing to clear mucus from your lungs. Follow these steps: 1. Lean your head a little forward. 2. Breathe in deeply. 3. Try to hold your breath for 3 seconds. 4. Keep your mouth slightly open while coughing 2 times. 5. Spit any mucus out into a tissue. 6. Rest and do the steps again 1 or 2 times as needed. General instructions  Make sure you get all the shots (vaccines) that your doctor recommends. Ask your doctor about a flu shot and a pneumonia shot.  Use oxygen therapy and pulmonary rehabilitation if told by your doctor. If you need home oxygen therapy, ask your doctor if you should buy a tool to measure your oxygen level (oximeter).  Make a COPD action plan with your doctor. This helps you to know what to do if you feel worse than usual.  Manage any other conditions you have as told by your doctor.  Avoid going outside when it is very hot, cold, or humid.  Avoid people who have a sickness you can catch (contagious).  Keep all follow-up visits as told by your doctor. This is important. Contact a doctor if:  You cough up more mucus than usual.  There is a change in the color or thickness of the mucus.  It is harder to breathe than usual.  Your breathing is faster than usual.  You have trouble sleeping.  You need to use your medicines more often than usual.  You have trouble doing your normal activities such as getting dressed or walking around the house. Get help right away if:  You  have shortness of breath while resting.  You have shortness of breath that stops you from: ? Being able to talk. ? Doing normal activities.  Your chest hurts for longer than 5 minutes.  Your skin color is more blue than usual.  Your pulse oximeter shows that you have low oxygen for longer than 5 minutes.  You have a fever.  You feel too tired to breathe normally. Summary  Chronic obstructive pulmonary disease (COPD) is a long-term lung problem.  The way your lungs work  will never return to normal. Usually the condition gets worse over time. There are things you can do to keep yourself as healthy as possible.  Take over-the-counter and prescription medicines only as told by your doctor.  If you smoke, stop. Smoking makes the problem worse. This information is not intended to replace advice given to you by your health care provider. Make sure you discuss any questions you have with your health care provider. Document Released: 09/08/2007 Document Revised: 04/26/2016 Document Reviewed: 04/26/2016 Elsevier Interactive Patient Education  2019 [employer].   Diabetes Mellitus and Nutrition, Adult When you have diabetes (diabetes mellitus), it is very important to have healthy eating habits because your blood sugar (glucose) levels are greatly affected by what you eat and drink. Eating healthy foods in the appropriate amounts, at about the same times every day, can help you:  Control your blood glucose.  Lower your risk of heart disease.  Improve your blood pressure.  Reach or maintain a healthy weight. Every person with diabetes is different, and each person has different needs for a meal plan. Your health care provider may recommend that you work with a diet and nutrition specialist (dietitian) to make a meal plan that is best for you. Your meal plan may vary depending on factors such as:  The calories you need.  The medicines you take.  Your weight.  Your blood glucose,  blood pressure, and cholesterol levels.  Your activity level.  Other health conditions you have, such as heart or kidney disease. How do carbohydrates affect me? Carbohydrates, also called carbs, affect your blood glucose level more than any other type of food. Eating carbs naturally raises the amount of glucose in your blood. Carb counting is a method for keeping track of how many carbs you eat. Counting carbs is important to keep your blood glucose at a healthy level, especially if you use insulin or take certain oral diabetes medicines. It is important to know how many carbs you can safely have in each meal. This is different for every person. Your dietitian can help you calculate how many carbs you should have at each meal and for each snack. Foods that contain carbs include:  Bread, cereal, rice, pasta, and crackers.  Potatoes and corn.  Peas, beans, and lentils.  Milk and yogurt.  Fruit and juice.  Desserts, such as cakes, cookies, ice cream, and candy. How does alcohol affect me? Alcohol can cause a sudden decrease in blood glucose (hypoglycemia), especially if you use insulin or take certain oral diabetes medicines. Hypoglycemia can be a life-threatening condition. Symptoms of hypoglycemia (sleepiness, dizziness, and confusion) are similar to symptoms of having too much alcohol. If your health care provider says that alcohol is safe for you, follow these guidelines:  Limit alcohol intake to no more than 1 drink per day for nonpregnant women and 2 drinks per day for men. One drink equals 12 oz of beer, 5 oz of wine, or 1 oz of hard liquor.  Do not drink on an empty stomach.  Keep yourself hydrated with water, diet soda, or unsweetened iced tea.  Keep in mind that regular soda, juice, and other mixers may contain a lot of sugar and must be counted as carbs. What are tips for following this plan?  Reading food labels  Start by checking the serving size on the "Nutrition  Facts" label of packaged foods and drinks. The amount of calories, carbs, fats, and other nutrients listed on the label is based  on one serving of the item. Many items contain more than one serving per package.  Check the total grams (g) of carbs in one serving. You can calculate the number of servings of carbs in one serving by dividing the total carbs by 15. For example, if a food has 30 g of total carbs, it would be equal to 2 servings of carbs.  Check the number of grams (g) of saturated and trans fats in one serving. Choose foods that have low or no amount of these fats.  Check the number of milligrams (mg) of salt (sodium) in one serving. Most people should limit total sodium intake to less than 2,300 mg per day.  Always check the nutrition information of foods labeled as "low-fat" or "nonfat". These foods may be higher in added sugar or refined carbs and should be avoided.  Talk to your dietitian to identify your daily goals for nutrients listed on the label. Shopping  Avoid buying canned, premade, or processed foods. These foods tend to be high in fat, sodium, and added sugar.  Shop around the outside edge of the grocery store. This includes fresh fruits and vegetables, bulk grains, fresh meats, and fresh dairy. Cooking  Use low-heat cooking methods, such as baking, instead of high-heat cooking methods like deep frying.  Cook using healthy oils, such as olive, canola, or sunflower oil.  Avoid cooking with butter, cream, or high-fat meats. Meal planning  Eat meals and snacks regularly, preferably at the same times every day. Avoid going long periods of time without eating.  Eat foods high in fiber, such as fresh fruits, vegetables, beans, and whole grains. Talk to your dietitian about how many servings of carbs you can eat at each meal.  Eat 4-6 ounces (oz) of lean protein each day, such as lean meat, chicken, fish, eggs, or tofu. One oz of lean protein is equal to: ? 1 oz of  meat, chicken, or fish. ? 1 egg. ?  cup of tofu.  Eat some foods each day that contain healthy fats, such as avocado, nuts, seeds, and fish. Lifestyle  Check your blood glucose regularly.  Exercise regularly as told by your health care provider. This may include: ? 150 minutes of moderate-intensity or vigorous-intensity exercise each week. This could be brisk walking, biking, or water aerobics. ? Stretching and doing strength exercises, such as yoga or weightlifting, at least 2 times a week.  Take medicines as told by your health care provider.  Do not use any products that contain nicotine or tobacco, such as cigarettes and e-cigarettes. If you need help quitting, ask your health care provider.  Work with a Social worker or diabetes educator to identify strategies to manage stress and any emotional and social challenges. Questions to ask a health care provider  Do I need to meet with a diabetes educator?  Do I need to meet with a dietitian?  What number can I call if I have questions?  When are the best times to check my blood glucose? Where to find more information:  American Diabetes Association: diabetes.org  Academy of Nutrition and Dietetics: www.eatright.[employer] of Diabetes and Digestive and Kidney Diseases (NIH): [employer] Summary  A healthy meal plan will help you control your blood glucose and maintain a healthy lifestyle.  Working with a diet and nutrition specialist (dietitian) can help you make a meal plan that is best for you.  Keep in mind that carbohydrates (carbs) and alcohol have immediate effects on your  blood glucose levels. It is important to count carbs and to use alcohol carefully. This information is not intended to replace advice given to you by your health care provider. Make sure you discuss any questions you have with your health care provider. Document Released: 12/17/2004 Document Revised: 10/20/2016 Document Reviewed:  04/26/2016 Elsevier Interactive Patient Education  2019 [employer].

## 2018-04-17 NOTE — Discharge Summary (Signed)
Physician Discharge Summary  Alex Martinez ZOX:[id number] DOB: 05/06/1965 DOA: 04/10/2018  PCP: Leonard Downing, MD  Admit date: 04/10/2018 Discharge date: 04/17/2018  Admitted From: Home Disposition: Home  Recommendations for Outpatient Follow-up:  Follow-up with PCP on 1/17 at 10 AM  #1 patient will be discharged on oral prednisone taper over the next 12 days.  He has new diagnosis of type 2 diabetes mellitus and is being discharged on oral metformin, essential hypertension for which he is discharged on metoprolol. #2 He also likely has underlying COPD. #3   Patient needs PET scan for lymphadenopathy seen on CT scan this hospitalization.  He will at least need a follow-up chest CT in about 6 weeks to ensure clearing.  Patient is very likely to be nonadherent to treatment and outpatient follow-up.  Home Health: None Equipment/Devices: None  Discharge Condition: Fair CODE STATUS: Full code Diet recommendation: Carb modified/2 g sodium   Discharge Diagnoses:  Principal Problem:   Sepsis due to lobar pneumonia ([place])  Active Problems: Lobar pneumonia   Acute on chronic respiratory failure with hypoxia (HCC)   Reactive airway disease   Pulmonary nodules   Lymphadenopathy   Hyponatremia   History of prediabetes   Alcohol abuse   Tobacco use   Type 2 diabetes mellitus with hyperglycemia, without long-term current use of insulin (HCC)   Essential hypertension   Sinus tachycardia  Brief narrative/HPI Please refer to admission H&P for details, in brief,53 year old male with history of tobacco use, alcohol use, prediabetes sent from PCP office for evaluation of possible pneumonia.  He was sent on 2 L O2 via nasal cannula.  Patient reported 3-4 days of increasing shortness of breath with productive clear sputum without any fevers, chills.  Reported decreased p.o. intake.  He reports being told of having prediabetes. Patient found to be septic secondary to lobar pneumonia along  with acute hypoxic respiratory failure.  Principal Problem:  Acute respiratory failure with hypoxia (HCC) Sepsis secondary to lobar pneumonia. Also has possible COPD (newly diagnosed) with exacerbation. Sepsis resolved.  Cultures negative. CT angiogram of the chest showed right upper lobe pneumonia and was negative for PE. Received 6 days of IV Rocephin.  Continue oral prednisone and will discharge on taper over the next 12 days.  Received few doses of Lasix and scheduled duo nebs which has improved his symptoms. Patient initially desaturating both at rest and ambulation, now improved and does not need home O2. 2D echo shows EF of 60-65% with no wall motion abnormality and grade 2 diastolic dysfunction.  Patient clinically improved and will be discharged on oral prednisone taper, albuterol inhaler and follow-up with his PCP later this week.   active Problems: Sinus tachycardia No signs of withdrawal.  Elevated blood pressure.  Started on metoprolol and dose increased today.Marland Kitchen   TSH was normal.  Improved on increasing dose of metoprolol and switching albuterol neb to Xopenex.  Diabetes mellitus type 2, uncontrolled with hyperglycemia A1c of 7.7.  Previously reported to be prediabetic.  Placed on Lantus with pre-meal aspart while in the hospital.  Will discharge him on oral metformin and follow-up with his PCP.  Prescribed blood glucose monitoring kit with supplies. Provide education on diet and monitoring his blood glucose.   Essential hypertension New with elevated blood pressure during hospital stay.  Improved with adding metoprolol and will be discharged on the same.     Pulmonary nodules Nodular opacity in the posterior right upper lobe, right middle lobe.  Cannot exclude  neoplastic etiology.  Recommends either a PET scan or at least follow-up chest CT in 4-6 weeks .  Tobacco abuse Smokes almost 2 packs/day.  Counseled strongly on cessation.  Nicotine patch given while in the  hospital.  Refuses nicotine patch upon discharge.   Alcohol abuse Counseled strongly on cessation.    No signs of withdrawal.  Hyponatremia  Possibly due to dehydration with sepsis.  Now resolved with fluids.   Family Communication  : None at bedside  Disposition Plan  : Home   Consults  : None  Procedures  : 2D echo, CT angiogram of the chest   Discharge Instructions   Allergies as of 04/17/2018      Reactions   [employer], Other (See Comments)   Shaking, starts sweating really bad   Yellow Jacket Venom [bee Venom] Swelling      Medication List    TAKE these medications   albuterol 108 (90 Base) MCG/ACT inhaler Commonly known as:  PROVENTIL HFA;VENTOLIN HFA Inhale 2 puffs into the lungs every 6 (six) hours as needed for wheezing or shortness of breath.   blood glucose meter kit and supplies Dispense based on patient and insurance preference. Use up to four times daily as directed. (FOR ICD-10 E10.9, E11.9).   guaiFENesin-dextromethorphan 100-10 MG/5ML syrup Commonly known as:  ROBITUSSIN DM Take 5 mLs by mouth every 4 (four) hours as needed for up to 5 days for cough.   metFORMIN 500 MG tablet Commonly known as:  GLUCOPHAGE Take 1 tablet (500 mg total) by mouth 2 (two) times daily with a meal.   metoprolol tartrate 50 MG tablet Commonly known as:  LOPRESSOR Take 1 tablet (50 mg total) by mouth 2 (two) times daily.   predniSONE 20 MG tablet Commonly known as:  DELTASONE Take 1 tablet (20 mg total) by mouth daily with breakfast.      Follow-up Information    Leonard Downing, MD.   Specialty:  Family Medicine Why:  please call to schedule apt--Appointment scheduled for Friday, April 21, 2018 at 10:00 a.m. with Dr. Arelia Sneddon. Contact information: [place] [address] [phone]          Allergies  Allergen Reactions  . [employer] and Other (See Comments)    Shaking, starts sweating really bad  . Yellow  Jacket Venom [Bee Venom] Swelling      Procedures/Studies: Dg Chest 2 View  Result Date: 04/10/2018 CLINICAL DATA:  Cough EXAM: CHEST - 2 VIEW COMPARISON:  08/09/2013 FINDINGS: Diffuse interstitial opacity suspect for interstitial inflammatory process versus interstitial edema. Normal heart size. No pleural effusion. No pneumothorax. Slight increased hilar opacity. IMPRESSION: Diffuse interstitial opacity, suspect for interstitial inflammation or edema. No focal consolidation. Increased density within the hilar regions bilaterally, possible nodes. Electronically Signed   By: Donavan Foil M.D.   On: 04/10/2018 17:55   Ct Angio Chest Pe W And/or Wo Contrast  Result Date: 04/10/2018 CLINICAL DATA:  Shortness of breath with cough EXAM: CT ANGIOGRAPHY CHEST WITH CONTRAST TECHNIQUE: Multidetector CT imaging of the chest was performed using the standard protocol during bolus administration of intravenous contrast. Multiplanar CT image reconstructions and MIPs were obtained to evaluate the vascular anatomy. CONTRAST:  75 mL ISOVUE-370 IOPAMIDOL (ISOVUE-370) INJECTION 76% COMPARISON:  Chest radiograph Ju April 10, 2018 FINDINGS: Cardiovascular: There is no demonstrable pulmonary embolus. There is no thoracic aortic aneurysm or dissection. The visualized great vessels appear normal. There are foci of aortic atherosclerosis. There  is no pericardial effusion or pericardial thickening. Prominence of the main pulmonary outflow tract is noted with a measured transverse diameter of 3.2 cm. Mediastinum/Nodes: Thyroid appears normal. There are multiple subcentimeter lymph nodes scattered throughout the mediastinum. There is a lymph node in the right hilum measuring 1.8 x 1.4 cm. There is a lymph node in the left hilum measuring 1.4 x 1.1 cm. There is a lymph node anterior to the carina measuring 1.1 x 0.9 cm. No esophageal lesions are evident. Lungs/Pleura: There are small bullae in each upper lobe. There are tree on bud  type opacities in the right upper lobe consistent with pneumonia. A focal area of irregular nodular opacity in the posterior segment of the right upper lobe measures 1.7 x 1.2 cm, best seen on axial slice 44 series 8. There is atelectatic change in the right middle lobe. There is a somewhat nodular opacity abutting the pleura in the lateral segment right middle lobe measuring 1.1 x 0.8 cm. Atelectatic changes noted in the posterior left base. No pleural effusion or pleural thickening evident. Upper Abdomen: There is hepatic steatosis. Visualized upper abdominal structures otherwise appear unremarkable. Musculoskeletal: There are no blastic or lytic bone lesions. No chest wall lesions are evident. Review of the MIP images confirms the above findings. IMPRESSION: 1. No demonstrable pulmonary embolus. No thoracic aortic aneurysm or dissection. There are foci of aortic atherosclerosis. 2. Prominence the main pulmonary outflow tract, a finding felt to be indicative of a degree of pulmonary arterial hypertension. 3. There is patchy tree on bud type appearance in the right upper lobe, felt to represent pneumonia in this area. There are areas of patchy atelectasis bilaterally. 4. There is a somewhat nodular appearing opacity in the posterior segment of the right upper lobe measuring 1.7 x 1.2 cm. A second nodular opacity in the right middle lobe laterally measures 1.1 x 0.8 cm. While these areas may represent foci pneumonia or atelectasis, neoplastic etiology in these areas cannot be excluded. It may be prudent to consider nuclear medicine PET study given these findings. At a minimum, a follow-up chest CT in 4-6 weeks to assess for potential clearing of areas of infiltrate may be advisable. 5. Several prominent lymph nodes noted. Etiology for adenopathy uncertain. Again, nuclear medicine PET study could be helpful to assess for abnormal metabolic activity in these lymph nodes which would heightened suspicion for potential  pneumonia. 6.  Hepatic steatosis. Aortic Atherosclerosis (ICD10-I70.0). Electronically Signed   By: Lowella Grip III M.D.   On: 04/10/2018 20:30    2D echo Study Conclusions  - Left ventricle: The cavity size was normal. Systolic function was   normal. The estimated ejection fraction was in the range of 60%   to 65%. Wall motion was normal; there were no regional wall   motion abnormalities. Features are consistent with a pseudonormal   left ventricular filling pattern, with concomitant abnormal   relaxation and increased filling pressure (grade 2 diastolic   dysfunction). Doppler parameters are consistent with high   ventricular filling pressure. - Pulmonary arteries: Systolic pressure could not be accurately   estimated.   Subjective: Feels better.  Maintaining sats on room air and on ambulation.  Cough and wheezing better as well.  Heart rate stable on the monitor.  Discharge Exam: Vitals:   04/17/18 0729 04/17/18 0745  BP:  (!) 135/95  Pulse:  80  Resp:    Temp:  98.6 F (37 C)  SpO2: 96% 92%   Vitals:  04/16/18 2140 04/16/18 2243 04/17/18 0729 04/17/18 0745  BP: 140/90 (!) 138/91  (!) 135/95  Pulse:  90  80  Resp:  18    Temp:  98.4 F (36.9 C)  98.6 F (37 C)  TempSrc:  Oral  Oral  SpO2:  96% 96% 92%  Weight:      Height:        General: Not in distress HEENT: Moist mucosa, supple neck Chest: Few scattered rhonchi CVs: Normal S1-S2, no murmurs GI: Soft, nondistended, nontender Musculoskeletal: Warm, no edema   The results of significant diagnostics from this hospitalization (including imaging, microbiology, ancillary and laboratory) are listed below for reference.     Microbiology: Recent Results (from the past 240 hour(s))  Blood Culture (routine x 2)     Status: None   Collection Time: 04/10/18  7:33 PM  Result Value Ref Range Status   Specimen Description BLOOD LEFT ANTECUBITAL  Final   Special Requests   Final    BOTTLES DRAWN AEROBIC  AND ANAEROBIC Blood Culture adequate volume   Culture   Final    NO GROWTH 5 DAYS Performed at [facility], [address] [place], [place] [address]    Report Status 04/15/2018 FINAL  Final  Blood Culture (routine x 2)     Status: None   Collection Time: 04/11/18  5:03 AM  Result Value Ref Range Status   Specimen Description BLOOD RIGHT ANTECUBITAL  Final   Special Requests   Final    BOTTLES DRAWN AEROBIC ONLY Blood Culture adequate volume   Culture   Final    NO GROWTH 5 DAYS Performed at [facility], [place] [address] [place], [place] [address]    Report Status 04/16/2018 FINAL  Final     Labs: BNP (last 3 results) Recent Labs    04/10/18 1930  BNP 817.7*   Basic Metabolic Panel: Recent Labs  Lab 04/10/18 1619 04/11/18 0502 04/12/18 0400 04/15/18 0755  NA 127* 137 133* 136  K 4.3 4.1 3.7 4.2  CL 85* 99 94* 93*  CO2 26 31 32 30  GLUCOSE 306* 174* 287* 193*  BUN <5* _0 CREATININE 0.84 0.61 0.72 0.65  CALCIUM 9.2 8.6* 8.5* 9.6   Liver Function Tests: Recent Labs  Lab 04/10/18 1619  AST 58*  ALT 47*  ALKPHOS 118  BILITOT 0.7  PROT 7.2  ALBUMIN 3.2*   No results for input(s): LIPASE, AMYLASE in the last 168 hours. No results for input(s): AMMONIA in the last 168 hours. CBC: Recent Labs  Lab 04/10/18 1619 04/12/18 0400  WBC 8.8 9.3  NEUTROABS 7.9*  --   HGB 17.0 14.2  HCT 51.6 45.2  MCV 97.0 101.1*  PLT 239 225   Cardiac Enzymes: No results for input(s): CKTOTAL, CKMB, CKMBINDEX, TROPONINI in the last 168 hours. BNP: Invalid input(s): POCBNP CBG: Recent Labs  Lab 04/16/18 0813 04/16/18 1224 04/16/18 1721 04/16/18 2138 04/17/18 0744  GLUCAP 163* 291* 352* 381* 241*   D-Dimer No results for input(s): DDIMER in the last 72 hours. Hgb A1c No results for input(s): HGBA1C in the last 72 hours. Lipid Profile No results for input(s): CHOL, HDL, LDLCALC, TRIG, CHOLHDL, LDLDIRECT in the last 72 hours. Thyroid function  studies Recent Labs    04/16/18 0904  TSH 2.098   Anemia work up No results for input(s): VITAMINB12, FOLATE, FERRITIN, TIBC, IRON, RETICCTPCT in the last 72 hours. Urinalysis    Component Value  Date/Time   COLORURINE YELLOW 04/10/2018 2325   APPEARANCEUR CLEAR 04/10/2018 2325   LABSPEC 1.028 04/10/2018 2325   PHURINE 6.0 04/10/2018 2325   GLUCOSEU 50 (A) 04/10/2018 2325   HGBUR SMALL (A) 04/10/2018 2325   BILIRUBINUR NEGATIVE 04/10/2018 2325   KETONESUR NEGATIVE 04/10/2018 2325   PROTEINUR NEGATIVE 04/10/2018 2325   UROBILINOGEN 0.2 11/18/2011 1755   NITRITE NEGATIVE 04/10/2018 2325   LEUKOCYTESUR NEGATIVE 04/10/2018 2325   Sepsis Labs Invalid input(s): PROCALCITONIN,  WBC,  LACTICIDVEN Microbiology Recent Results (from the past 240 hour(s))  Blood Culture (routine x 2)     Status: None   Collection Time: 04/10/18  7:33 PM  Result Value Ref Range Status   Specimen Description BLOOD LEFT ANTECUBITAL  Final   Special Requests   Final    BOTTLES DRAWN AEROBIC AND ANAEROBIC Blood Culture adequate volume   Culture   Final    NO GROWTH 5 DAYS Performed at [facility], [place] [address] [place], [place] [address]    Report Status 04/15/2018 FINAL  Final  Blood Culture (routine x 2)     Status: None   Collection Time: 04/11/18  5:03 AM  Result Value Ref Range Status   Specimen Description BLOOD RIGHT ANTECUBITAL  Final   Special Requests   Final    BOTTLES DRAWN AEROBIC ONLY Blood Culture adequate volume   Culture   Final    NO GROWTH 5 DAYS Performed at [facility], [place] [address], [place], [place] [address]    Report Status 04/16/2018 FINAL  Final     Time coordinating discharge: 35 minutes  SIGNED:   Louellen Molder, MD  [facility] 04/17/2018, 11:07 AM Pager   If 7PM-7AM, please contact night-coverage [email or web address] Password [email or web address]

## 2021-01-09 ENCOUNTER — Observation Stay (HOSPITAL_COMMUNITY)
Admission: EM | Admit: 2021-01-09 | Discharge: 2021-01-10 | Disposition: A | Discharge status: Home / Self Care | Payer: Self-pay | Attending: Emergency Medicine | Admitting: Emergency Medicine

## 2021-01-09 ENCOUNTER — Emergency Department (HOSPITAL_COMMUNITY): Payer: Self-pay

## 2021-01-09 ENCOUNTER — Encounter (HOSPITAL_COMMUNITY): Payer: Self-pay | Admitting: Emergency Medicine

## 2021-01-09 DIAGNOSIS — J9691 Respiratory failure, unspecified with hypoxia: Secondary | ICD-10-CM | POA: Diagnosis present

## 2021-01-09 DIAGNOSIS — Z79899 Other long term (current) drug therapy: Secondary | ICD-10-CM | POA: Insufficient documentation

## 2021-01-09 DIAGNOSIS — E8770 Fluid overload, unspecified: Secondary | ICD-10-CM

## 2021-01-09 DIAGNOSIS — R918 Other nonspecific abnormal finding of lung field: Secondary | ICD-10-CM | POA: Diagnosis present

## 2021-01-09 DIAGNOSIS — E871 Hypo-osmolality and hyponatremia: Secondary | ICD-10-CM

## 2021-01-09 DIAGNOSIS — I2721 Secondary pulmonary arterial hypertension: Secondary | ICD-10-CM | POA: Diagnosis present

## 2021-01-09 DIAGNOSIS — R911 Solitary pulmonary nodule: Secondary | ICD-10-CM | POA: Insufficient documentation

## 2021-01-09 DIAGNOSIS — I5033 Acute on chronic diastolic (congestive) heart failure: Secondary | ICD-10-CM | POA: Diagnosis present

## 2021-01-09 DIAGNOSIS — J441 Chronic obstructive pulmonary disease with (acute) exacerbation: Secondary | ICD-10-CM | POA: Diagnosis present

## 2021-01-09 DIAGNOSIS — I700 Atherosclerosis of aorta: Secondary | ICD-10-CM | POA: Diagnosis present

## 2021-01-09 DIAGNOSIS — Z5971 Insufficient health insurance coverage: Secondary | ICD-10-CM

## 2021-01-09 DIAGNOSIS — Z5989 Other problems related to housing and economic circumstances: Secondary | ICD-10-CM

## 2021-01-09 DIAGNOSIS — E119 Type 2 diabetes mellitus without complications: Secondary | ICD-10-CM | POA: Insufficient documentation

## 2021-01-09 DIAGNOSIS — Z20822 Contact with and (suspected) exposure to covid-19: Secondary | ICD-10-CM | POA: Insufficient documentation

## 2021-01-09 DIAGNOSIS — Y900 Blood alcohol level of less than 20 mg/100 ml: Secondary | ICD-10-CM | POA: Insufficient documentation

## 2021-01-09 DIAGNOSIS — J449 Chronic obstructive pulmonary disease, unspecified: Secondary | ICD-10-CM | POA: Diagnosis present

## 2021-01-09 DIAGNOSIS — Z7984 Long term (current) use of oral hypoglycemic drugs: Secondary | ICD-10-CM | POA: Insufficient documentation

## 2021-01-09 DIAGNOSIS — F17210 Nicotine dependence, cigarettes, uncomplicated: Secondary | ICD-10-CM | POA: Insufficient documentation

## 2021-01-09 DIAGNOSIS — I509 Heart failure, unspecified: Secondary | ICD-10-CM

## 2021-01-09 DIAGNOSIS — E1159 Type 2 diabetes mellitus with other circulatory complications: Secondary | ICD-10-CM | POA: Diagnosis present

## 2021-01-09 DIAGNOSIS — J9621 Acute and chronic respiratory failure with hypoxia: Principal | ICD-10-CM | POA: Diagnosis present

## 2021-01-09 DIAGNOSIS — F17200 Nicotine dependence, unspecified, uncomplicated: Secondary | ICD-10-CM | POA: Diagnosis present

## 2021-01-09 DIAGNOSIS — I152 Hypertension secondary to endocrine disorders: Secondary | ICD-10-CM | POA: Diagnosis present

## 2021-01-09 DIAGNOSIS — I110 Hypertensive heart disease with heart failure: Secondary | ICD-10-CM | POA: Insufficient documentation

## 2021-01-09 DIAGNOSIS — E1165 Type 2 diabetes mellitus with hyperglycemia: Secondary | ICD-10-CM | POA: Diagnosis present

## 2021-01-09 DIAGNOSIS — F1020 Alcohol dependence, uncomplicated: Secondary | ICD-10-CM | POA: Diagnosis present

## 2021-01-09 DIAGNOSIS — Z6835 Body mass index (BMI) 35.0-35.9, adult: Secondary | ICD-10-CM

## 2021-01-09 DIAGNOSIS — J9601 Acute respiratory failure with hypoxia: Secondary | ICD-10-CM | POA: Diagnosis present

## 2021-01-09 HISTORY — DX: Disorder of kidney and ureter, unspecified: N28.9

## 2021-01-09 HISTORY — DX: Heart failure, unspecified: I50.9

## 2021-01-09 HISTORY — DX: Lobar pneumonia, unspecified organism: J18.1

## 2021-01-09 LAB — CBC WITH DIFFERENTIAL/PLATELET
Abs Immature Granulocytes: 0.02 K/uL (ref 0.00–0.07)
Basophils Absolute: 0.1 K/uL (ref 0.0–0.1)
Basophils Relative: 1 %
Eosinophils Absolute: 0.3 K/uL (ref 0.0–0.5)
Eosinophils Relative: 5 %
HCT: 47.8 % (ref 39.0–52.0)
Hemoglobin: 15.2 g/dL (ref 13.0–17.0)
Immature Granulocytes: 0 %
Lymphocytes Relative: 19 %
Lymphs Abs: 1.3 K/uL (ref 0.7–4.0)
MCH: 31.5 pg (ref 26.0–34.0)
MCHC: 31.8 g/dL (ref 30.0–36.0)
MCV: 99.2 fL (ref 80.0–100.0)
Monocytes Absolute: 0.6 K/uL (ref 0.1–1.0)
Monocytes Relative: 9 %
Neutro Abs: 4.3 K/uL (ref 1.7–7.7)
Neutrophils Relative %: 66 %
Platelets: 232 K/uL (ref 150–400)
RBC: 4.82 MIL/uL (ref 4.22–5.81)
RDW: 15.3 % (ref 11.5–15.5)
WBC: 6.6 K/uL (ref 4.0–10.5)
nRBC: 0 % (ref 0.0–0.2)

## 2021-01-09 LAB — BASIC METABOLIC PANEL
Anion gap: 6 (ref 5–15)
BUN: 8 mg/dL (ref 6–20)
CO2: 40 mmol/L — ABNORMAL HIGH (ref 22–32)
Calcium: 8.7 mg/dL — ABNORMAL LOW (ref 8.9–10.3)
Chloride: 84 mmol/L — ABNORMAL LOW (ref 98–111)
Creatinine, Ser: 0.61 mg/dL (ref 0.61–1.24)
GFR, Estimated: >60 mL/min (ref >60)
Glucose, Bld: 126 mg/dL — ABNORMAL HIGH (ref 70–99)
Potassium: 4.1 mmol/L (ref 3.5–5.1)
Sodium: 130 mmol/L — ABNORMAL LOW (ref 135–145)

## 2021-01-09 LAB — RESP PANEL BY RT-PCR (FLU A&B, COVID) ARPGX2
Influenza A by PCR: NEGATIVE
Influenza B by PCR: NEGATIVE
SARS Coronavirus 2 by RT PCR: NEGATIVE

## 2021-01-09 LAB — BRAIN NATRIURETIC PEPTIDE: B Natriuretic Peptide: 761.7 pg/mL — ABNORMAL HIGH (ref 0.0–100.0)

## 2021-01-09 LAB — TROPONIN I (HIGH SENSITIVITY)
Troponin I (High Sensitivity): 16 ng/L (ref <18)
Troponin I (High Sensitivity): 15 ng/L (ref <18)

## 2021-01-09 MED ORDER — ENOXAPARIN SODIUM 40 MG/0.4ML IJ SOSY
40.0000 mg | PREFILLED_SYRINGE | INTRAMUSCULAR | Status: DC
  Administered 2021-01-10: 40 mg via SUBCUTANEOUS
  Filled 2021-01-09: qty 0.4

## 2021-01-09 MED ORDER — ALBUTEROL SULFATE (2.5 MG/3ML) 0.083% IN NEBU: 2.5000 mg | INHALATION_SOLUTION | Freq: Four times a day (QID) | RESPIRATORY_TRACT | Status: DC | PRN

## 2021-01-09 MED ORDER — PREDNISONE 20 MG PO TABS: 40.0000 mg | ORAL_TABLET | Freq: Every day | ORAL | Status: DC

## 2021-01-09 MED ORDER — ACETAMINOPHEN 650 MG RE SUPP: 650.0000 mg | Freq: Four times a day (QID) | RECTAL | Status: DC | PRN

## 2021-01-09 MED ORDER — FLUTICASONE FUROATE-VILANTEROL 100-25 MCG/INH IN AEPB
1.0000 | INHALATION_SPRAY | Freq: Every day | RESPIRATORY_TRACT | Status: DC
  Filled 2021-01-09: qty 28

## 2021-01-09 MED ORDER — IPRATROPIUM-ALBUTEROL 0.5-2.5 (3) MG/3ML IN SOLN
3.0000 mL | Freq: Once | RESPIRATORY_TRACT | Status: AC
  Filled 2021-01-09: qty 3

## 2021-01-09 MED ORDER — IPRATROPIUM-ALBUTEROL 0.5-2.5 (3) MG/3ML IN SOLN
RESPIRATORY_TRACT | Status: AC
  Administered 2021-01-09: 3 mL via RESPIRATORY_TRACT
  Filled 2021-01-09: qty 3

## 2021-01-09 MED ORDER — IPRATROPIUM-ALBUTEROL 0.5-2.5 (3) MG/3ML IN SOLN
3.0000 mL | RESPIRATORY_TRACT | Status: DC
  Administered 2021-01-10 (×2): 3 mL via RESPIRATORY_TRACT
  Filled 2021-01-09 (×2): qty 3

## 2021-01-09 MED ORDER — ADULT MULTIVITAMIN W/MINERALS CH: 1.0000 | ORAL_TABLET | Freq: Every day | ORAL | Status: DC

## 2021-01-09 MED ORDER — ACETAMINOPHEN 325 MG PO TABS: 650.0000 mg | ORAL_TABLET | Freq: Four times a day (QID) | ORAL | Status: DC | PRN

## 2021-01-09 MED ORDER — FUROSEMIDE 10 MG/ML IJ SOLN
120.0000 mg | Freq: Once | INTRAVENOUS | Status: AC
  Administered 2021-01-09: 120 mg via INTRAVENOUS
  Filled 2021-01-09: qty 10

## 2021-01-09 MED ORDER — METFORMIN HCL 500 MG PO TABS
500.0000 mg | ORAL_TABLET | Freq: Two times a day (BID) | ORAL | Status: DC
  Administered 2021-01-10: 500 mg via ORAL
  Filled 2021-01-09: qty 1

## 2021-01-09 MED ORDER — IPRATROPIUM-ALBUTEROL 0.5-2.5 (3) MG/3ML IN SOLN
3.0000 mL | Freq: Once | RESPIRATORY_TRACT | Status: AC
  Administered 2021-01-09: 3 mL via RESPIRATORY_TRACT
  Filled 2021-01-09: qty 3

## 2021-01-09 MED ORDER — THIAMINE HCL 100 MG PO TABS: 100.0000 mg | ORAL_TABLET | Freq: Every day | ORAL | Status: DC

## 2021-01-09 MED ORDER — FOLIC ACID 1 MG PO TABS: 1.0000 mg | ORAL_TABLET | Freq: Every day | ORAL | Status: DC

## 2021-01-09 MED ORDER — IPRATROPIUM-ALBUTEROL 0.5-2.5 (3) MG/3ML IN SOLN
3.0000 mL | Freq: Once | RESPIRATORY_TRACT | Status: AC
  Administered 2021-01-09: 3 mL via RESPIRATORY_TRACT

## 2021-01-09 MED ORDER — FUROSEMIDE 10 MG/ML IJ SOLN
120.0000 mg | Freq: Once | INTRAVENOUS | Status: AC
  Administered 2021-01-10: 120 mg via INTRAVENOUS
  Filled 2021-01-09: qty 10

## 2021-01-09 MED ORDER — INSULIN ASPART 100 UNIT/ML IJ SOLN
0.0000 [IU] | Freq: Three times a day (TID) | INTRAMUSCULAR | Status: DC
  Administered 2021-01-10: 5 [IU] via SUBCUTANEOUS

## 2021-01-09 MED ORDER — METHYLPREDNISOLONE SODIUM SUCC 125 MG IJ SOLR
125.0000 mg | Freq: Once | INTRAMUSCULAR | Status: AC
  Administered 2021-01-10: 125 mg via INTRAVENOUS
  Filled 2021-01-09: qty 2

## 2021-01-09 NOTE — H&P (Addendum)
[facility] Admission History and Physical Service Pager: [phone]  Patient name: Alex Martinez Medical record number: [id number] Date of birth: 10-30-1965 Age: 55 y.o. Gender: male  Primary Care Provider: Leonard Downing, MD Consultants: None Code Status: Full  Preferred Emergency Contact: Dr. Arelia Sneddon [phone], office [phone]  Chief Complaint: Shortness of breath, lower extremity and scrotal swelling, chest pain  Assessment and Plan: Alex Martinez is a 55 y.o. male presenting with shortness of breath, lower extremity and scrotal swelling, and chest pain. PMH is significant for HFpEF, alcohol use disorder, tobacco use, pulmonary nodules, T2DM.  Acute hypoxic respiratory failure likely 2/2 to CHF exacerbation Patient presented with shortness of breath and bilateral lower extremity and scrotal swelling for 3 weeks, worsening for the past 7 days.  He was started on Lasix 80 mg twice daily by his PCP 1 week ago.  He reports compliance.  No fever, chills, nausea, vomiting, diarrhea.  He has a cough which has been chronic and ongoing for years, patient also reports intermittent black/brown sputum production. In the ED, he presented with BP 131/88, 98 HR, 99 F temperature, 24 RR.  He had diffuse wheezing and was hypoxic.  Currently on 4L oxygen via nasal cannula.  He denies use of home oxygen.  COVID PCR negative.  Influenza A/influenza B PCR negative.  Labs remarkable for elevated BNP to 761.7, hyponatremia with Na+ 130, K+ 4.1,serum Cr 0.6, glucose 126.  Chest x-ray with mildly coarsened perihilar and bibasilar interstitial markings, which may reflect mild edema and prominence of bilateral hilar regions reflecting underlying pulmonary hypertension or pulmonary vascular congestion.  No evidence of acute respiratory infection given largely unremarkable chest x-ray with no focal consolidations, afebrile nature, no leukocytosis.  Received DuoNeb x3, IV 120  mg Lasix.  The swelling is likely secondary to CHF exacerbation or pulmonary hypertension.  Edema unlikely secondary to DVT, due to bilateral nature and pitting edema.  Well score for PE 1.5 points, placing him in the low risk group.  No hemoptysis, nor pleuritic chest pain. Intermittent tachycardia noted, however, patient has received multiple albuterol treatments. Patient requires admission for diuresis and new oxygen requirement likely due to HF exacerbation.  -Admit to med-telemetry, attending Dr. McDiarmid -Monitor vital signs per floor -Continuous cardiac monitoring -Continuous pulse oximetry -Echocardiogram -Strict I's/O's -Daily weights -Supplemental oxygen as needed, goal saturation greater than 90% -Continue diuresis with IV Lasix 120 mg,expect to transition to PO lasix 10/7 or 10/8 - IV Solu-Medrol 125 mg x 1, will likely transition to PO prednisone tomorrow 40 mg once a day -AM CMP   Scrotal swelling Exam notable for severe scrotal edema. Fluctuant, not firm and equal distribution of swelling.  Scrotum does not transilluminate with light.  This is likely secondary to systemic edema in setting of CHF.  No dysuria, urethral discharge, no difficulty passing urine, patient has multiple nearly full urinals at the bedside, UOP 1.51m with lasix dosing so far.  No pain. -Continue to monitor with diuresis   Reactive airway disease Patient had significant wheezing upon arrival to the ED which improved with duo nebs.  He has diffuse rhonchi and wheezing in all lung fields per my exam associated with prolonged respiratory phase.  He is prescribed albuterol inhaler which he used yesterday.  Given his extensive tobacco use history, he likely has undiagnosed emphysema or COPD.  CAT score of 10-20, as he gets breathless easily and coughs up phlegm regularly.  He also has worsening symptoms with  laying flat at night. -Albuterol every 6 hours as needed -DuoNeb every 4 hours for 3 doses -Breo Ellipta  1 puff daily -Recommend outpatient PFTs  Tobacco use Currently smokes 1 to 2 packs per day, since the age of 48.  Offered patient nicotine patch, but he denied it because he says that it makes him "wired."  He does have a lighter & pack of cigarettes in his room, and was voicing desire to go outside and smoke. Patient was educated extensively regarding necessity for smoking cessation and policy to not smoke especially while in the hospital. -Education on smoking cessation   Alcohol use disorder Patient drinks 3-4 42 ounce drinks per day.  He reports that his last drink was 7 days ago, and he has had no withdrawal symptoms or tremors since then.  He says symptoms usually start 2 to 3 days after quitting drinking.  No history of withdrawal seizures or hospitalization for alcohol withdrawal.  No history of DT. Multiple ER visits in the past to pursue alcohol detoxification.  -CIWA monitoring, Ativan as needed  -Folate, multivitamin, thiamine -Ethanol level -TOC consult for alcohol cessation   Type 2 Diabetes Mellitus Last A1c per chart review 7.7% on 04/11/2018. Blood glucose 126 on arrival. Home medications: Metformin 547m BID -Continue home medications -sensitive sliding scale insulin  -CBG every 4 hours -A1c  Depressed mood Patient expresses passive suicidal ideation, denies current plan.  He he says he "lives alone by choice" on land that is owned by his PCP apparently.  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  Per my exam, he has difficulty staying on topic and is tangential in conversation.  Mood and affect were normal, he was not tearful. -Ethanol level -UDS -Patient would benefit from completing outpatient PHQ-9 -Consider starting SSRI if patient is agreeable   FEN/GI: Heart healthy Prophylaxis: Lovenox  Disposition: Med-surg  History of Present Illness:  Alex KROEKERis a 55y.o. male presenting with SOB associated with leg and scrotal swelling.  He reports that his swelling started in the ankle and knees and then proceeded to increase over the course of three weeks. He states that he gets SOB with walking a short distance. He reports getting a prescription for lasix twice daily on 10-3.   He reports that he has never been diagnosed with COPD. He reports that he does not wear oxygen at home as he "cannot afford it".   Smokes 1 PPD and has been smoking since age 55   He reports drinking daily and quit last week. He states that he drinks 3 42-oz bottles, more recently he reports that he is only able to drink 4 oz of beer before the swelling in his legs started worsening.   Review Of Systems: Per HPI with the following additions:  Review of Systems  Constitutional:  Negative for appetite change, chills and fever.  HENT:  Negative for sore throat.   Respiratory:  Positive for cough, shortness of breath and wheezing.        Black/brown sputum   Cardiovascular:  Positive for leg swelling. Negative for chest pain.  Gastrointestinal:  Positive for abdominal pain.  Genitourinary:  Positive for scrotal swelling. Negative for dysuria and hematuria.  Skin:  Positive for wound.  Allergic/Immunologic: Negative for environmental allergies.  Neurological:  Negative for headaches.  Psychiatric/Behavioral:  Positive for dysphoric mood.     Patient Active Problem List   Diagnosis Date Noted  Type 2 diabetes mellitus with hyperglycemia, without long-term current use of insulin ([place]) 04/17/2018   Sepsis due to pneumonia ([place]) 04/17/2018   Essential hypertension 04/17/2018   Sinus tachycardia 04/17/2018   Lobar pneumonia ([place])    Acute respiratory failure with hypoxia ([place])    Acute on chronic respiratory failure with hypoxia ([place]) 04/11/2018   Reactive airway disease 04/11/2018   Pulmonary nodules 04/11/2018   Lymphadenopathy 04/11/2018   Hyponatremia 04/11/2018   History of prediabetes 04/11/2018   Alcohol abuse 04/11/2018   Tobacco use  04/11/2018   CAP (community acquired pneumonia) 04/10/2018   S/P alcohol detoxification 12/05/2013    Past Medical History: Past Medical History:  Diagnosis Date   Alcohol abuse    Congestive heart failure (CHF) ([place])    Diabetes mellitus without complication ([place])    Renal disorder     Past Surgical History: Past Surgical History:  Procedure Laterality Date   arm surgery     KNEE SURGERY      Social History: Social History   Tobacco Use   Smoking status: Every Day    Packs/day: 2.00    Years: 37.00    Pack years: 74.00    Types: Cigarettes   Smokeless tobacco: Never  Substance Use Topics   Alcohol use: Yes    Alcohol/week: 56.0 standard drinks    Types: 56 Cans of beer per week    Comment: daily   Drug use: No   Family History: Family History  Adopted: Yes  Problem Relation Age of Onset   Cancer Father     Allergies and Medications: Allergies  Allergen Reactions   [employer] and Other (See Comments)    Shaking, starts sweating really bad   Yellow Jacket Venom [Bee Venom] Swelling   No current facility-administered medications on file prior to encounter.   Current Outpatient Medications on File Prior to Encounter  Medication Sig Dispense Refill   albuterol (PROVENTIL HFA;VENTOLIN HFA) 108 (90 Base) MCG/ACT inhaler Inhale 2 puffs into the lungs every 6 (six) hours as needed for wheezing or shortness of breath. 1 Inhaler 2   furosemide (LASIX) 80 MG tablet Take 160 mg by mouth daily.     blood glucose meter kit and supplies Dispense based on patient and insurance preference. Use up to four times daily as directed. (FOR ICD-10 E10.9, E11.9). 1 each 0   metFORMIN (GLUCOPHAGE) 500 MG tablet Take 1 tablet (500 mg total) by mouth 2 (two) times daily with a meal. 60 tablet 0   metoprolol tartrate (LOPRESSOR) 50 MG tablet Take 1 tablet (50 mg total) by mouth 2 (two) times daily. (Patient not taking: Reported on 01/09/2021) 60 tablet 0   predniSONE (DELTASONE) 20  MG tablet Take 1 tablet (20 mg total) by mouth daily with breakfast. (Patient not taking: Reported on 01/09/2021) 16 tablet 0    Objective: BP 115/71   Pulse 97   Temp 99 F (37.2 C) (Oral)   Resp (!) 30   Ht _0  (1.778 m)   Wt 112 kg   SpO2 98%   BMI 35.44 kg/m   Exam: General: Well-appearing, alert and awake, laying in the stretcher Eyes: EOMI, PERRLA ENTM: Dried blood on right ear helix.  Edentulous upper mouth, poor dentition and lower teeth.  Mucous membranes moist.  No pharyngeal erythema or exudate. Neck: Normal range of motion, supple Cardiovascular: Regular rate and rhythm.  No murmurs appreciated Respiratory: Diffuse wheezing and rhonchi in all lung fields.  On  3 L nasal cannula, in no respiratory distress.  Speaking in full sentences.  Coughs while in the room with white frothy sputum production. Derm: See image below.  4+ pitting edema in bilateral lower extremities up to the groin region.  Extremely taut, erythematous and warm skin. Neuro: No focal neurological deficits Gastrointestinal: Abdomen soft, diffuse tenderness to palpation R>L MSK: No joint deformities. Psych: Requires frequent redirection, easily distracted, endorses depression symptoms frequently.  Tangential speaking. Left legRight leg  Labs and Imaging: CBC BMET  Recent Labs  Lab 01/09/21 1245  WBC 6.6  HGB 15.2  HCT 47.8  PLT 232   Recent Labs  Lab 01/09/21 1245  NA 130*  K 4.1  CL 84*  CO2 40*  BUN 8  CREATININE 0.61  GLUCOSE 126*  CALCIUM 8.7*     EKG: Normal sinus rhythm, 97 bpm.  No acute ST or T wave changes.  No QT prolongation  Orvis Brill, DO 01/09/2021, 7:53 PM PGY-1, [facility] pager: [phone], text pages welcome  FPTS Upper-Level Resident Addendum   I have independently interviewed and examined the patient. I have discussed the above with Dr.Dameron and agree with the documented plan. My edits for correction/addition/clarification are  included above. Please see any attending notes.   Eulis Foster, MD PGY-3, [facility] 01/10/2021 1:13 AM  FPTS Service pager: [phone] (text pages welcome through [facility])

## 2021-01-09 NOTE — ED Triage Notes (Signed)
Pt reports increasing SOB and leg swelling for the last month. Pt with labored RR. Sat 80% on RA. Now 99 on 4L [place]. Pt with hx of ETOH, quit drinking 1 week ago.

## 2021-01-09 NOTE — ED Provider Notes (Addendum)
I have personally seen and examined the patient. I have reviewed the documentation on PMH/FH/Soc Hx. I have discussed the plan of care with the resident and patient.  I have reviewed and agree with the resident's documentation. Please see associated encounter note.  Briefly, the patient is a 55 y.o. male here with shortness of breath.  Patient with new hypoxia in the 80s that improved on 3 L of oxygen.  Appears to be volume overloaded.  BNP is elevated as well as chest x-ray shows volume overload.  Has gross swelling of his legs and scrotum.  Rales on exam.  Started recently on fluid pill by his primary care doctor.  Denies any chest pain.  EKG shows sinus rhythm.  No infectious symptoms otherwise.  No significant anemia, electrolyte abnormality, kidney injury otherwise.  No leukocytosis.  Patient was given IV Lasix and to be admitted to medicine for further diuresis, echocardiogram, further work-up.  Patient admitted for hypoxic respiratory failure likely in the setting of volume overload.  This chart was dictated using voice recognition software.  Despite best efforts to proofread,  errors can occur which can change the documentation meaning.    .Critical Care Performed by: Lennice Sites, DO Authorized by: Lennice Sites, DO   Critical care provider statement:    Critical care time (minutes):  35   Critical care time was exclusive of:  Separately billable procedures and treating other patients   Critical care was necessary to treat or prevent imminent or life-threatening deterioration of the following conditions:  Respiratory failure   Critical care was time spent personally by me on the following activities:  Blood draw for specimens, development of treatment plan with patient or surrogate, discussions with consultants, evaluation of patient's response to treatment, examination of patient, obtaining history from patient or surrogate, ordering and performing treatments and interventions, ordering  and review of laboratory studies, ordering and review of radiographic studies, pulse oximetry, review of old charts and re-evaluation of patient's condition   Care discussed with: admitting provider     EKG Interpretation  Date/Time:  Friday January 09 2021 12:48:38 EDT Ventricular Rate:  97 PR Interval:  134 QRS Duration: 84 QT Interval:  354 QTC Calculation: 449 R Axis:   133 Text Interpretation: Normal sinus rhythm Right axis deviation Abnormal ECG Confirmed by Lennice Sites ([id number]) on 01/09/2021 7:28:43 PM          Lennice Sites, DO 01/09/21 1954    Lennice Sites, DO 01/09/21 1955

## 2021-01-09 NOTE — ED Provider Notes (Signed)
[facility] Provider Note   CSN: [id number] Arrival date & time: 01/09/21  1143     History Chief Complaint  Patient presents with   Shortness of Breath    Alex Martinez is a 55 y.o. male.  HPI This is a 55 year old male with history of CHF, diabetes, hypertension who presents with shortness of breath and leg swelling.  Patient reports leg swelling that started several weeks ago.  Was seen by PCP and started on Lasix 80 mg twice daily this week.  Patient has been compliant with Lasix but swelling in the legs has worsened.  Swelling now present also in the scrotum unilaterally.  Denies any pain to the testicles or trauma to the testicles.  Associated with chest pain described as central, nonradiating, 5 out of 10 in severity, not pleuritic.  No associated diaphoresis, nausea, or vomiting.  Is associated with shortness of breath that is worse when lying flat.  Patient is a current smoker but has no formal diagnosis of COPD, reports his cough is at baseline.  No increase sputum production and no recent fevers or other infectious symptoms.  No history of blood clots.    Past Medical History:  Diagnosis Date   Alcohol abuse    Congestive heart failure (CHF) ([place])    Diabetes mellitus without complication ([place])    Renal disorder     Patient Active Problem List   Diagnosis Date Noted   Respiratory failure with hypoxia ([place]) 01/09/2021   Acute on chronic diastolic congestive heart failure (HCC)    Edema due to congestive heart failure ([place])    Type 2 diabetes mellitus with hyperglycemia, without long-term current use of insulin ([place]) 04/17/2018   Sepsis due to pneumonia ([place]) 04/17/2018   Essential hypertension 04/17/2018   Sinus tachycardia 04/17/2018   Lobar pneumonia ([place])    Acute respiratory failure with hypoxia ([place])    Acute on chronic respiratory failure with hypoxia ([place]) 04/11/2018   Reactive airway disease 04/11/2018    Pulmonary nodules 04/11/2018   Lymphadenopathy 04/11/2018   Hyponatremia 04/11/2018   History of prediabetes 04/11/2018   Alcohol abuse 04/11/2018   Tobacco use 04/11/2018   CAP (community acquired pneumonia) 04/10/2018   S/P alcohol detoxification 12/05/2013    Past Surgical History:  Procedure Laterality Date   arm surgery     KNEE SURGERY         Family History  Adopted: Yes  Problem Relation Age of Onset   Cancer Father     Social History   Tobacco Use   Smoking status: Every Day    Packs/day: 2.00    Years: 37.00    Pack years: 74.00    Types: Cigarettes   Smokeless tobacco: Never  Substance Use Topics   Alcohol use: Yes    Alcohol/week: 56.0 standard drinks    Types: 56 Cans of beer per week    Comment: daily   Drug use: No    Home Medications Prior to Admission medications   Medication Sig Start Date End Date Taking? Authorizing Provider  albuterol (PROVENTIL HFA;VENTOLIN HFA) 108 (90 Base) MCG/ACT inhaler Inhale 2 puffs into the lungs every 6 (six) hours as needed for wheezing or shortness of breath. 04/17/18  Yes Dhungel, Nishant, MD  furosemide (LASIX) 80 MG tablet Take 160 mg by mouth daily.   Yes [provider]  blood glucose meter kit and supplies Dispense based on patient and insurance preference. Use up to four  times daily as directed. (FOR ICD-10 E10.9, E11.9). 04/17/18   Dhungel, Flonnie Overman, MD  metFORMIN (GLUCOPHAGE) 500 MG tablet Take 1 tablet (500 mg total) by mouth 2 (two) times daily with a meal. 04/17/18 04/17/19  Dhungel, Flonnie Overman, MD  metoprolol tartrate (LOPRESSOR) 50 MG tablet Take 1 tablet (50 mg total) by mouth 2 (two) times daily. Patient not taking: Reported on 01/09/2021 04/17/18   Dhungel, Flonnie Overman, MD  predniSONE (DELTASONE) 20 MG tablet Take 1 tablet (20 mg total) by mouth daily with breakfast. Patient not taking: Reported on 01/09/2021 04/17/18   Louellen Molder, MD    Allergies    Fire ant and Yellow jacket venom [bee  venom]  Review of Systems   Review of Systems  Constitutional:  Positive for fatigue. Negative for chills and fever.  HENT:  Negative for ear pain and sore throat.   Eyes:  Negative for pain and visual disturbance.  Respiratory:  Positive for cough and shortness of breath.   Cardiovascular:  Positive for chest pain and leg swelling. Negative for palpitations.  Gastrointestinal:  Negative for abdominal pain and vomiting.  Genitourinary:  Negative for dysuria and hematuria.  Musculoskeletal:  Negative for arthralgias and back pain.  Skin:  Negative for color change and rash.  Neurological:  Negative for seizures and syncope.  All other systems reviewed and are negative.  Physical Exam Updated Vital Signs BP 132/81   Pulse (!) 103   Temp 99 F (37.2 C) (Oral)   Resp 20   Ht _0  (1.778 m)   Wt 112 kg   SpO2 98%   BMI 35.44 kg/m   Physical Exam Vitals and nursing note reviewed.  Constitutional:      Appearance: He is well-developed.  HENT:     Head: Normocephalic and atraumatic.  Eyes:     Conjunctiva/sclera: Conjunctivae normal.  Cardiovascular:     Rate and Rhythm: Normal rate and regular rhythm.     Heart sounds: No murmur heard. Pulmonary:     Effort: Pulmonary effort is normal. No respiratory distress.     Breath sounds: Examination of the right-lower field reveals rhonchi. Examination of the left-lower field reveals rhonchi. Wheezing and rhonchi present.  Abdominal:     Palpations: Abdomen is soft.     Tenderness: There is no abdominal tenderness.  Genitourinary:    Comments: Scrotum with pitting edema. No TTP. No skin changes.  Musculoskeletal:     Cervical back: Neck supple.     Comments: 3+ pitting edema to the thighs  Skin:    General: Skin is warm and dry.  Neurological:     Mental Status: He is alert.    ED Results / Procedures / Treatments   Labs (all labs ordered are listed, but only abnormal results are displayed) Labs Reviewed  BASIC  METABOLIC PANEL - Abnormal; Notable for the following components:      Result Value   Sodium 130 (*)    Chloride 84 (*)    CO2 40 (*)    Glucose, Bld 126 (*)    Calcium 8.7 (*)    All other components within normal limits  BRAIN NATRIURETIC PEPTIDE - Abnormal; Notable for the following components:   B Natriuretic Peptide 761.7 (*)    All other components within normal limits  CBG MONITORING, ED - Abnormal; Notable for the following components:   Glucose-Capillary 125 (*)    All other components within normal limits  RESP PANEL BY RT-PCR (FLU A&B, COVID) ARPGX2  CBC WITH DIFFERENTIAL/PLATELET  ETHANOL  HEMOGLOBIN A1C  HIV ANTIBODY (ROUTINE TESTING W REFLEX)  RAPID URINE DRUG SCREEN, HOSP PERFORMED  COMPREHENSIVE METABOLIC PANEL  TROPONIN I (HIGH SENSITIVITY)  TROPONIN I (HIGH SENSITIVITY)    EKG EKG Interpretation  Date/Time:  Friday January 09 2021 12:48:38 EDT Ventricular Rate:  97 PR Interval:  134 QRS Duration: 84 QT Interval:  354 QTC Calculation: 449 R Axis:   133 Text Interpretation: Normal sinus rhythm Right axis deviation Abnormal ECG Confirmed by Lennice Sites ([id number]) on 01/09/2021 7:28:43 PM  Radiology DG Chest 2 View  Result Date: 01/09/2021 CLINICAL DATA:  Shortness of breath EXAM: CHEST - 2 VIEW COMPARISON:  04/10/2018 FINDINGS: Heart size is within normal limits. Similar degree of prominence of the bilateral hilar regions may reflect underlying pulmonary hypertension or pulmonary vascular congestion. Mildly coarsened perihilar and bibasilar interstitial markings. No pleural effusion or pneumothorax. IMPRESSION: 1. Mildly coarsened perihilar and bibasilar interstitial markings, which may reflect mild edema. 2. Similar degree of prominence of the bilateral hilar regions may reflect underlying pulmonary hypertension or pulmonary vascular congestion. Electronically Signed   By: Davina Poke D.O.   On: 01/09/2021 13:42    Procedures Procedures   Medications  Ordered in ED Medications  metFORMIN (GLUCOPHAGE) tablet 500 mg (has no administration in time range)  albuterol (PROVENTIL) (2.5 MG/3ML) 0.083% nebulizer solution 2.5 mg (has no administration in time range)  insulin aspart (novoLOG) injection 0-9 Units (has no administration in time range)  fluticasone furoate-vilanterol (BREO ELLIPTA) 100-25 MCG/INH 1 puff (has no administration in time range)  enoxaparin (LOVENOX) injection 40 mg (40 mg Subcutaneous Given 01/10/21 0023)  ipratropium-albuterol (DUONEB) 0.5-2.5 (3) MG/3ML nebulizer solution 3 mL (3 mLs Nebulization Given 01/10/21 0023)  thiamine tablet 100 mg (has no administration in time range)  folic acid (FOLVITE) tablet 1 mg (has no administration in time range)  multivitamin with minerals tablet 1 tablet (has no administration in time range)  furosemide (LASIX) 120 mg in dextrose 5 % 50 mL IVPB (has no administration in time range)  predniSONE (DELTASONE) tablet 40 mg (has no administration in time range)  acetaminophen (TYLENOL) tablet 650 mg (has no administration in time range)    Or  acetaminophen (TYLENOL) suppository 650 mg (has no administration in time range)  ipratropium-albuterol (DUONEB) 0.5-2.5 (3) MG/3ML nebulizer solution 3 mL (3 mLs Nebulization Given 01/09/21 1905)  ipratropium-albuterol (DUONEB) 0.5-2.5 (3) MG/3ML nebulizer solution 3 mL (3 mLs Nebulization Given 01/09/21 1858)  ipratropium-albuterol (DUONEB) 0.5-2.5 (3) MG/3ML nebulizer solution 3 mL (3 mLs Nebulization Given 01/09/21 1848)  furosemide (LASIX) 120 mg in dextrose 5 % 50 mL IVPB (0 mg Intravenous Stopped 01/09/21 2034)  methylPREDNISolone sodium succinate (SOLU-MEDROL) 125 mg/2 mL injection 125 mg (125 mg Intravenous Given 01/10/21 0021)    ED Course  I have reviewed the triage vital signs and the nursing notes.  Pertinent labs & imaging results that were available during my care of the patient were reviewed by me and considered in my medical decision  making (see chart for details).    MDM Rules/Calculators/A&P                          Patient is hemodynamically stable and well-appearing on initial evaluation but has a new oxygen requirement, currently at 5 L by nasal cannula.  Has crackles on lung exam in the bases with diffuse wheezing.  Based on cough, wheezing, and orthopnea with significant 3+ pitting edema  up to the thighs and involving the scrotum, suspect that patient likely has both COPD and acute CHF exacerbation.    EKG without acute evidence of ischemia and initial troponin is negative. Doubt acute ACS. CBC and BMP otherwise unremarkable, but BNP is elevated greater than 700.  Chest x-ray with pulmonary edema but no evidence of other cardiopulmonary abnormality including pneumonia.  Patient given 120 mg of Lasix and 3 duo nebs.  Respiratory status improved and patient now titrated to 2 L by nasal cannula. LE swelling is bilateral and pitting, low suspicion for PE.   Admitted to medicine for CHF exacerbation with new O2 requirement for diuresis and repeat echo.  Final Clinical Impression(s) / ED Diagnoses Final diagnoses:  Hypervolemia, unspecified hypervolemia type  Acute respiratory failure with hypoxia [facility])    Rx / DC Orders ED Discharge Orders     None        Coralee Pesa, MD 01/10/21 0122    Lennice Sites, DO 01/10/21 8548

## 2021-01-09 NOTE — ED Provider Notes (Signed)
Emergency Medicine Provider Triage Evaluation Note  Alex Martinez , a 55 y.o. male  was evaluated in triage.  Pt complains of sob.  Review of Systems  Positive: Sob, cp, legs and scrotal swelling Negative: Fever  Physical Exam  There were no vitals taken for this visit. Gen:   Awake, no distress   Resp:  Normal effort  MSK:   Moves extremities without difficulty  Other:  Anasarca, tachypneic  Medical Decision Making  Medically screening exam initiated at 12:42 PM.  Appropriate orders placed.  ABUNDIO TEUSCHER was informed that the remainder of the evaluation will be completed by another provider, this initial triage assessment does not replace that evaluation, and the importance of remaining in the ED until their evaluation is complete.  Pt with hx of CHF here with progressive worsening SOB and fluid retention over 1 month.  Hx of alcohol abuse.  Appears fluid overload and was hypoxic with O2 at 83% on RA, placed on 6L of [place]   Domenic Moras, PA-C 01/09/21 1244    Regan Lemming, MD 01/09/21 [phone]

## 2021-01-10 ENCOUNTER — Other Ambulatory Visit (HOSPITAL_COMMUNITY): Payer: Self-pay

## 2021-01-10 ENCOUNTER — Encounter (HOSPITAL_COMMUNITY): Payer: Self-pay | Admitting: Family Medicine

## 2021-01-10 ENCOUNTER — Observation Stay (HOSPITAL_BASED_OUTPATIENT_CLINIC_OR_DEPARTMENT_OTHER): Payer: Self-pay

## 2021-01-10 ENCOUNTER — Other Ambulatory Visit: Payer: Self-pay

## 2021-01-10 DIAGNOSIS — J449 Chronic obstructive pulmonary disease, unspecified: Secondary | ICD-10-CM | POA: Diagnosis present

## 2021-01-10 DIAGNOSIS — Z6835 Body mass index (BMI) 35.0-35.9, adult: Secondary | ICD-10-CM

## 2021-01-10 DIAGNOSIS — I152 Hypertension secondary to endocrine disorders: Secondary | ICD-10-CM

## 2021-01-10 DIAGNOSIS — I5033 Acute on chronic diastolic (congestive) heart failure: Secondary | ICD-10-CM

## 2021-01-10 DIAGNOSIS — E1165 Type 2 diabetes mellitus with hyperglycemia: Secondary | ICD-10-CM

## 2021-01-10 DIAGNOSIS — Z5989 Other problems related to housing and economic circumstances: Secondary | ICD-10-CM

## 2021-01-10 DIAGNOSIS — E8770 Fluid overload, unspecified: Secondary | ICD-10-CM

## 2021-01-10 DIAGNOSIS — J441 Chronic obstructive pulmonary disease with (acute) exacerbation: Secondary | ICD-10-CM

## 2021-01-10 DIAGNOSIS — E1159 Type 2 diabetes mellitus with other circulatory complications: Secondary | ICD-10-CM

## 2021-01-10 DIAGNOSIS — I2721 Secondary pulmonary arterial hypertension: Secondary | ICD-10-CM | POA: Diagnosis present

## 2021-01-10 DIAGNOSIS — I700 Atherosclerosis of aorta: Secondary | ICD-10-CM | POA: Diagnosis present

## 2021-01-10 HISTORY — DX: Chronic obstructive pulmonary disease with (acute) exacerbation: J44.1

## 2021-01-10 HISTORY — DX: Atherosclerosis of aorta: I70.0

## 2021-01-10 LAB — COMPREHENSIVE METABOLIC PANEL
ALT: 17 U/L (ref 0–44)
AST: 26 U/L (ref 15–41)
Albumin: 3.2 g/dL — ABNORMAL LOW (ref 3.5–5.0)
Alkaline Phosphatase: 98 U/L (ref 38–126)
Anion gap: 8 (ref 5–15)
BUN: 7 mg/dL (ref 6–20)
CO2: 41 mmol/L — ABNORMAL HIGH (ref 22–32)
Calcium: 8.8 mg/dL — ABNORMAL LOW (ref 8.9–10.3)
Chloride: 85 mmol/L — ABNORMAL LOW (ref 98–111)
Creatinine, Ser: 0.67 mg/dL (ref 0.61–1.24)
GFR, Estimated: >60 mL/min (ref >60)
Glucose, Bld: 137 mg/dL — ABNORMAL HIGH (ref 70–99)
Potassium: 4.4 mmol/L (ref 3.5–5.1)
Sodium: 134 mmol/L — ABNORMAL LOW (ref 135–145)
Total Bilirubin: 1.2 mg/dL (ref 0.3–1.2)
Total Protein: 6.5 g/dL (ref 6.5–8.1)

## 2021-01-10 LAB — RAPID URINE DRUG SCREEN, HOSP PERFORMED
Amphetamines: NOT DETECTED
Barbiturates: NOT DETECTED
Benzodiazepines: NOT DETECTED
Cocaine: NOT DETECTED
Opiates: NOT DETECTED
Tetrahydrocannabinol: NOT DETECTED

## 2021-01-10 LAB — CBG MONITORING, ED
Glucose-Capillary: 125 mg/dL — ABNORMAL HIGH (ref 70–99)
Glucose-Capillary: 284 mg/dL — ABNORMAL HIGH (ref 70–99)

## 2021-01-10 LAB — ECHOCARDIOGRAM COMPLETE
Area-P 1/2: 5.42 cm2
Height: 70 in
S' Lateral: 3.4 cm
Weight: 3952 oz

## 2021-01-10 LAB — ETHANOL: Alcohol, Ethyl (B): <10 mg/dL (ref <10)

## 2021-01-10 LAB — HIV ANTIBODY (ROUTINE TESTING W REFLEX): HIV Screen 4th Generation wRfx: NONREACTIVE

## 2021-01-10 LAB — HEMOGLOBIN A1C
Hgb A1c MFr Bld: 6.7 % — ABNORMAL HIGH (ref 4.8–5.6)
Mean Plasma Glucose: 145.59 mg/dL

## 2021-01-10 NOTE — Discharge Summary (Addendum)
Alex Martinez  Patient name: Alex Martinez Medical record number: [id number] Date of birth: [birth year] Age: 55 y.o. Gender: male Date of Admission: 01/09/2021  Date of Discharge: 10/08 Admitting Physician: Blane Ohara McDiarmid, MD  Primary Care Provider: Leonard Downing, MD Consultants: None  Indication for Hospitalization: Shortness of breath and lower extremity/scrotal edema  Discharge Diagnoses/Problem List:  Cor Pulmonale COPD Pulmonary nodule HTN DM Type 2 EtOH and Tobacco use  Disposition: Home  Discharge Condition: [place] ADVICE  Discharge Exam: Not complete, patient left [facility]  Brief Hospital Course:  Alex Martinez is a 55 year old male who presented with bilateral lower extremity swelling, shortness of breath for 7 days.  He has a past medical history of type 2 diabetes mellitus, HFpEF, alcohol use disorder, tobacco use, pulmonary nodules, history of prediabetes.  Hospital course as outlined below:  Acute hypoxic respiratory failure secondary to CHF exacerbation Patient presented to the ED with BP 131/88, 98 HR, 99 F temperature, 24 RR.  He had diffuse wheezing and rhonchi with chest x-ray showing mild edema reflecting underlying pulmonary hypertension or pulmonary vascular congestion.  He required supplemental oxygen, 3 L nasal cannula.  Without nasal cannula in place, SPO2 desaturated to 80%.  Labs remarkable for BNP elevation to 761, Na+ 130, K+ 4.1, serum creatinine 0.6.  He received DuoNeb x3, IV Lasix 120 mg.  He was admitted to [facility] and continued to receive IV diuresis and transition to oral steroids. Echo was performed and was pending when patient left the the hospital AMA, attempt to reach him via phone was unsuccessful.   Reactive airway disease Per chart review, patient has never had official diagnosis of COPD or emphysema.  He has had a cough for months, with sputum production,  that does limit his physical activity.  He becomes breathless frequently.  He has a CAT score of 10-20.  Required multiple breathing treatments upon admission with duo nebs.  Treated with Breo Ellipta and albuterol as needed.  Type 2 diabetes mellitus Blood glucose was well controlled while inpatient.  His home medications of metformin 500 mg twice daily was resumed with sliding scale insulin coverage.     Follow-up items for PCP 1.  Outpatient PFTs to officially diagnose COPD 2.  Consider LAMA/LABA treatment 3.  Follow-up on depressed mood.  Consider psychiatric evaluation.   Significant Procedures: ECHO  Significant Labs and Imaging:  Recent Labs  Lab 01/09/21 1245  WBC 6.6  HGB 15.2  HCT 47.8  PLT 232   Recent Labs  Lab 01/09/21 1245 01/10/21 0335  NA 130* 134*  K 4.1 4.4  CL 84* 85*  CO2 40* 41*  GLUCOSE 126* 137*  BUN 8 7  CREATININE 0.61 0.67  CALCIUM 8.7* 8.8*  ALKPHOS  --  98  AST  --  26  ALT  --  17  ALBUMIN  --  3.2*   ECHO:10/08 Left ventricular ejection fraction, by estimation, is 60 to 65%. The left ventricle has normal function. The left ventricle has no regional wall motion abnormalities. Left ventricular diastolic parameters were normal. There is the interventricular septum is flattened in systole, consistent with right ventricular pressure overload. 1.Right ventricular systolic function is normal. The right ventricular size is severely enlarged. There is moderately elevated pulmonary artery systolic pressure. The estimated right ventricular systolic pressure is 78.2 mmHg. 2.The TR jet is very faint - suspect systolic PA pressure is underestimated. 3. Left atrial size was  mildly dilated. 4. Right atrial size was severely dilated. The mitral valve is normal in structure. No evidence of mitral valve regurgitation. No evidence of mitral stenosis. 5.The aortic valve is normal in structure. There is mild calcification of the aortic valve. There is  mild thickening of the aortic valve. Aortic valve regurgitation is not visualized. Mild to moderate aortic valve sclerosis/calcification is present, without any evidence of aortic stenosis. 6.The inferior vena cava is dilated in size with <50% respiratory variability, suggesting right atrial pressure of 15 mmHg.   Results/Tests Pending at Time of Discharge:   Discharge Medications:  Allergies as of 01/10/2021       Reactions   [employer], Other (See Comments)   Shaking, starts sweating really bad   Yellow Jacket Venom [bee Venom] Swelling        Medication List     STOP taking these medications    predniSONE 20 MG tablet Commonly known as: DELTASONE       TAKE these medications    albuterol 108 (90 Base) MCG/ACT inhaler Commonly known as: VENTOLIN HFA Inhale 2 puffs into the lungs every 6 (six) hours as needed for wheezing or shortness of breath.   blood glucose meter kit and supplies Dispense based on patient and insurance preference. Use up to four times daily as directed. (FOR ICD-10 E10.9, E11.9).   furosemide 80 MG tablet Commonly known as: LASIX Take 160 mg by mouth daily.   metFORMIN 500 MG tablet Commonly known as: Glucophage Take 1 tablet (500 mg total) by mouth 2 (two) times daily with a meal.   metoprolol tartrate 50 MG tablet Commonly known as: LOPRESSOR Take 1 tablet (50 mg total) by mouth 2 (two) times daily.        Discharge Instructions: Please refer to Patient Instructions section of EMR for full details.  Patient was counseled important signs and symptoms that should prompt return to medical care, changes in medications, dietary instructions, activity restrictions, and follow up appointments.   Follow-Up Appointments:   Carollee Leitz, MD 01/10/2021, 8:34 PM PGY-3, [place]

## 2021-01-10 NOTE — Progress Notes (Signed)
  Echocardiogram 2D Echocardiogram has been performed.  Alex Martinez 01/10/2021, 9:51 AM

## 2021-01-10 NOTE — ED Notes (Signed)
Pt ambulatory to restroom. Refuses to wear oxygen.

## 2021-01-10 NOTE — ED Notes (Signed)
Pt back from Echo, requesting to leave at this time. This RN explained the process of being here and why he should stay.  Pt proceeded to get dressed and take his IV out. Pt made aware that I was trying to get a hold of the doctor for him to talk to

## 2021-01-10 NOTE — ED Notes (Signed)
Pt walking out the room and down the hallway.  Charge RN, Admitting MD, EDP made aware

## 2021-01-10 NOTE — Progress Notes (Addendum)
Called primary ED RN and requested collection of UDS ordered at time of admission. During our call, RN reported SpO2 desaturation to 50s% while sleeping. At that time, he was reported by RN  to not have [place] on properly. RN replaced prongs appropriately and SpO2 returned to 80s/90s.   I went to evaluate patient at bedside and he was sleeping in the stretcher laying on his back, partially elevated. [place] was in place with SpO2 oscillating between 88-90% on 4L, tachycardia to 100-110 bpm, tachypnea 20-24 RR which did not change with increase to 6L. He snored with gasping intermittently which is concerning for OSA. He is otherwise hemodynamically stable. Will continue to monitor VS closely.  Orvis Brill, DO PGY-1, [facility] 01/10/2021 2:45am [place] Intern pager: [phone], text pages welcome

## 2021-01-10 NOTE — Discharge Instructions (Signed)
Thank you for letting us care for you during your stay.  You were admitted to the [facility] Medicine Teaching Service.   You were admitted for worsening shortness of breath and increase in swelling.  Alex Martinez.  Please follow up with your primary care physician in 1 week.   If your symptoms worsen or return, please return to the hospital.  Please let us know if you have questions about your stay at [facility].

## 2021-01-10 NOTE — Hospital Course (Addendum)
Alex Martinez is a 55 year old male who presented with bilateral lower extremity swelling, shortness of breath for 7 days.  He has a past medical history of type 2 diabetes mellitus, HFpEF, alcohol use disorder, tobacco use, pulmonary nodules, history of prediabetes.  Hospital course as outlined below:  Acute hypoxic respiratory failure secondary to CHF exacerbation Patient presented to the ED with BP 131/88, 98 HR, 99 F temperature, 24 RR.  He had diffuse wheezing and rhonchi with chest x-ray showing mild edema reflecting underlying pulmonary hypertension or pulmonary vascular congestion.  He required supplemental oxygen, 3 L nasal cannula.  Without nasal cannula in place, SPO2 desaturated to 80%.  Labs remarkable for BNP elevation to 761, Na+ 130, K+ 4.1, serum creatinine 0.6.  He received DuoNeb x3, IV Lasix 120 mg.  He was admitted to [facility] and continued to receive IV diuresis and transition to oral steroids. Echo was performed and was pending when patient left the the hospital AMA, attempt to reach him via phone was unsuccessful.   Reactive airway disease Per chart review, patient has never had official diagnosis of COPD or emphysema.  He has had a cough for months, with sputum production, that does limit his physical activity.  He becomes breathless frequently.  He has a CAT score of 10-20.  Required multiple breathing treatments upon admission with duo nebs.  Treated with Breo Ellipta and albuterol as needed.  Type 2 diabetes mellitus Blood glucose was well controlled while inpatient.  His home medications of metformin 500 mg twice daily was resumed with sliding scale insulin coverage.     Follow-up items for PCP 1.  Outpatient PFTs to officially diagnose COPD 2.  Consider LAMA/LABA treatment 3.  Follow-up on depressed mood.  Consider psychiatric evaluation.

## 2021-01-27 ENCOUNTER — Other Ambulatory Visit: Payer: Self-pay

## 2021-01-27 ENCOUNTER — Inpatient Hospital Stay (HOSPITAL_COMMUNITY)
Admission: EM | Admit: 2021-01-27 | Discharge: 2021-02-04 | DRG: 190 | Disposition: A | Discharge status: Home / Self Care | Payer: Self-pay | Attending: Family Medicine | Admitting: Family Medicine

## 2021-01-27 ENCOUNTER — Emergency Department (HOSPITAL_COMMUNITY): Payer: Self-pay

## 2021-01-27 ENCOUNTER — Encounter (HOSPITAL_COMMUNITY): Payer: Self-pay | Admitting: *Deleted

## 2021-01-27 DIAGNOSIS — F419 Anxiety disorder, unspecified: Secondary | ICD-10-CM | POA: Diagnosis present

## 2021-01-27 DIAGNOSIS — F17210 Nicotine dependence, cigarettes, uncomplicated: Secondary | ICD-10-CM | POA: Diagnosis present

## 2021-01-27 DIAGNOSIS — Z20822 Contact with and (suspected) exposure to covid-19: Secondary | ICD-10-CM | POA: Diagnosis present

## 2021-01-27 DIAGNOSIS — I2723 Pulmonary hypertension due to lung diseases and hypoxia: Secondary | ICD-10-CM | POA: Diagnosis present

## 2021-01-27 DIAGNOSIS — R338 Other retention of urine: Secondary | ICD-10-CM | POA: Diagnosis present

## 2021-01-27 DIAGNOSIS — N401 Enlarged prostate with lower urinary tract symptoms: Secondary | ICD-10-CM | POA: Diagnosis present

## 2021-01-27 DIAGNOSIS — I110 Hypertensive heart disease with heart failure: Secondary | ICD-10-CM | POA: Diagnosis present

## 2021-01-27 DIAGNOSIS — Z8619 Personal history of other infectious and parasitic diseases: Secondary | ICD-10-CM

## 2021-01-27 DIAGNOSIS — Z91038 Other insect allergy status: Secondary | ICD-10-CM

## 2021-01-27 DIAGNOSIS — M810 Age-related osteoporosis without current pathological fracture: Secondary | ICD-10-CM | POA: Diagnosis present

## 2021-01-27 DIAGNOSIS — G9349 Other encephalopathy: Secondary | ICD-10-CM | POA: Diagnosis present

## 2021-01-27 DIAGNOSIS — Z91030 Bee allergy status: Secondary | ICD-10-CM

## 2021-01-27 DIAGNOSIS — F10239 Alcohol dependence with withdrawal, unspecified: Secondary | ICD-10-CM | POA: Diagnosis present

## 2021-01-27 DIAGNOSIS — I5082 Biventricular heart failure: Secondary | ICD-10-CM | POA: Diagnosis present

## 2021-01-27 DIAGNOSIS — M4856XA Collapsed vertebra, not elsewhere classified, lumbar region, initial encounter for fracture: Secondary | ICD-10-CM | POA: Diagnosis present

## 2021-01-27 DIAGNOSIS — I700 Atherosclerosis of aorta: Secondary | ICD-10-CM | POA: Diagnosis present

## 2021-01-27 DIAGNOSIS — Z7984 Long term (current) use of oral hypoglycemic drugs: Secondary | ICD-10-CM

## 2021-01-27 DIAGNOSIS — J9622 Acute and chronic respiratory failure with hypercapnia: Secondary | ICD-10-CM | POA: Diagnosis present

## 2021-01-27 DIAGNOSIS — I2721 Secondary pulmonary arterial hypertension: Secondary | ICD-10-CM | POA: Diagnosis present

## 2021-01-27 DIAGNOSIS — J9691 Respiratory failure, unspecified with hypoxia: Secondary | ICD-10-CM | POA: Diagnosis present

## 2021-01-27 DIAGNOSIS — R339 Retention of urine, unspecified: Secondary | ICD-10-CM

## 2021-01-27 DIAGNOSIS — I5032 Chronic diastolic (congestive) heart failure: Secondary | ICD-10-CM | POA: Diagnosis present

## 2021-01-27 DIAGNOSIS — E119 Type 2 diabetes mellitus without complications: Secondary | ICD-10-CM | POA: Diagnosis present

## 2021-01-27 DIAGNOSIS — Z79899 Other long term (current) drug therapy: Secondary | ICD-10-CM

## 2021-01-27 DIAGNOSIS — M549 Dorsalgia, unspecified: Secondary | ICD-10-CM

## 2021-01-27 DIAGNOSIS — E873 Alkalosis: Secondary | ICD-10-CM | POA: Diagnosis present

## 2021-01-27 DIAGNOSIS — J9621 Acute and chronic respiratory failure with hypoxia: Secondary | ICD-10-CM | POA: Diagnosis present

## 2021-01-27 DIAGNOSIS — J441 Chronic obstructive pulmonary disease with (acute) exacerbation: Principal | ICD-10-CM | POA: Diagnosis present

## 2021-01-27 DIAGNOSIS — R0902 Hypoxemia: Secondary | ICD-10-CM

## 2021-01-27 LAB — COMPREHENSIVE METABOLIC PANEL
ALT: 15 U/L (ref 0–44)
AST: 26 U/L (ref 15–41)
Albumin: 3.2 g/dL — ABNORMAL LOW (ref 3.5–5.0)
Alkaline Phosphatase: 97 U/L (ref 38–126)
Anion gap: 11 (ref 5–15)
BUN: 16 mg/dL (ref 6–20)
CO2: 38 mmol/L — ABNORMAL HIGH (ref 22–32)
Calcium: 8.8 mg/dL — ABNORMAL LOW (ref 8.9–10.3)
Chloride: 83 mmol/L — ABNORMAL LOW (ref 98–111)
Creatinine, Ser: 0.78 mg/dL (ref 0.61–1.24)
GFR, Estimated: >60 mL/min (ref >60)
Glucose, Bld: 99 mg/dL (ref 70–99)
Potassium: 4.1 mmol/L (ref 3.5–5.1)
Sodium: 132 mmol/L — ABNORMAL LOW (ref 135–145)
Total Bilirubin: 1.4 mg/dL — ABNORMAL HIGH (ref 0.3–1.2)
Total Protein: 6.8 g/dL (ref 6.5–8.1)

## 2021-01-27 LAB — CBC WITH DIFFERENTIAL/PLATELET
Abs Immature Granulocytes: 0.01 10*3/uL (ref 0.00–0.07)
Basophils Absolute: 0 10*3/uL (ref 0.0–0.1)
Basophils Relative: 1 %
Eosinophils Absolute: 0.5 10*3/uL (ref 0.0–0.5)
Eosinophils Relative: 7 %
HCT: 49.1 % (ref 39.0–52.0)
Hemoglobin: 15.1 g/dL (ref 13.0–17.0)
Immature Granulocytes: 0 %
Lymphocytes Relative: 13 %
Lymphs Abs: 1 10*3/uL (ref 0.7–4.0)
MCH: 30 pg (ref 26.0–34.0)
MCHC: 30.8 g/dL (ref 30.0–36.0)
MCV: 97.4 fL (ref 80.0–100.0)
Monocytes Absolute: 0.5 10*3/uL (ref 0.1–1.0)
Monocytes Relative: 7 %
Neutro Abs: 5.1 10*3/uL (ref 1.7–7.7)
Neutrophils Relative %: 72 %
Platelets: 294 10*3/uL (ref 150–400)
RBC: 5.04 MIL/uL (ref 4.22–5.81)
RDW: 15.8 % — ABNORMAL HIGH (ref 11.5–15.5)
WBC: 7.1 10*3/uL (ref 4.0–10.5)
nRBC: 0 % (ref 0.0–0.2)

## 2021-01-27 LAB — TROPONIN I (HIGH SENSITIVITY)
Troponin I (High Sensitivity): 17 ng/L (ref <18)
Troponin I (High Sensitivity): 18 ng/L — ABNORMAL HIGH (ref <18)

## 2021-01-27 LAB — URINALYSIS, ROUTINE W REFLEX MICROSCOPIC
Bacteria, UA: NONE SEEN
Bilirubin Urine: NEGATIVE
Glucose, UA: NEGATIVE mg/dL
Ketones, ur: NEGATIVE mg/dL
Leukocytes,Ua: NEGATIVE
Nitrite: NEGATIVE
Protein, ur: 30 mg/dL — AB
Specific Gravity, Urine: 1.009 (ref 1.005–1.030)
pH: 5 (ref 5.0–8.0)

## 2021-01-27 LAB — BRAIN NATRIURETIC PEPTIDE: B Natriuretic Peptide: 685.6 pg/mL — ABNORMAL HIGH (ref 0.0–100.0)

## 2021-01-27 MED ORDER — IPRATROPIUM-ALBUTEROL 0.5-2.5 (3) MG/3ML IN SOLN
3.0000 mL | Freq: Once | RESPIRATORY_TRACT | Status: DC
  Filled 2021-01-27: qty 3

## 2021-01-27 MED ORDER — FUROSEMIDE 10 MG/ML IJ SOLN
80.0000 mg | Freq: Once | INTRAMUSCULAR | Status: DC
  Filled 2021-01-27: qty 8

## 2021-01-27 NOTE — ED Triage Notes (Signed)
Pt sent here from PCP's office for difficulty urinating.  Only able to urinate 1 time per day.  Breathing also labored.  Placed o 3 L [place] for sats of 83% upon arrival.  Satis increased to 97% on 3L.  Left ama several days ago.

## 2021-01-27 NOTE — ED Notes (Signed)
Charge RN notified that we will be inserting foley catheter

## 2021-01-27 NOTE — ED Provider Notes (Signed)
Emergency Medicine Provider Triage Evaluation Note  Alex Martinez , a 55 y.o. male  was evaluated in triage.  Pt complains of urinary retention for the last 2 days only able to urinate independently once per day.  Additionally versus shortness of breath but unable to tell me when this started.  Very poor historian.  Hypoxic to the 80s upon arrival, placed on 3 L O2 by [place] with improvement in his symptoms and desaturation.  Review of Systems  Positive: Shortness of breath, urinary retention Negative: Chest pain, palpitations  Physical Exam  BP (!) 132/96 (BP Location: Right Arm)   Pulse 99   Temp 98.5 F (36.9 C) (Oral)   Resp (!) 22   SpO2 97%  Gen:   Awake, no distress   Resp:  Normal effort  MSK:   Moves extremities without difficulty  Other:  3+ lower extremity pitting edema, RRR no M Sahar history.  Lungs CTA B.  Abdomen generally nontender but somewhat distended.  Medical Decision Making  Medically screening exam initiated at 4:49 PM.  Appropriate orders placed.  Alex Martinez was informed that the remainder of the evaluation will be completed by another provider, this initial triage assessment does not replace that evaluation, and the importance of remaining in the ED until their evaluation is complete.   Bladder scan ordered, Foley catheter ordered, labs and shortness of breath work-up ordered.  This chart was dictated using voice recognition software, Dragon. Despite the best efforts of this provider to proofread and correct errors, errors may still occur which can change documentation meaning.    Aura Dials 01/27/21 1655    Charlesetta Shanks, MD 01/28/21 1415

## 2021-01-27 NOTE — ED Notes (Signed)
Foley insertion attempted x1 by Caitlynn RN - met lots of resistance, unsuccessful attempt - PA Notified - pt feels need to urinate, sitting up at bedside to use urinal

## 2021-01-27 NOTE — ED Provider Notes (Signed)
[facility] EMERGENCY DEPARTMENT Provider Note   CSN: [id number] Arrival date & time: 01/27/21  1546     History Chief Complaint  Patient presents with   Urinary Retention    CARLETON VANVALKENBURGH is a 55 y.o. male.  Patient with history of COPD, CHF, diabetes presents today with shortness of breath and urinary retention.  Patient is very poor historian, however states that he has been short of breath with significant bilateral lower extremity swelling for approximately 2 months.  He was seen here at the beginning of October for evaluation of same and admitted for CHF exacerbation and hypoxia in the 80s.  He states that he got angry with staff few days into his admission and left AMA.  He has not been on oxygen at home in the interim.  He now presents today with bilateral lower extremity swelling into his scrotum as well.  Of note, patient is a pack-a-day smoker and has been since he was 55 years old.  History of alcohol abuse, however states that he no longer consumes alcohol.  No recreational drug use.  Additionally patient states that he is having difficulty with urinary retention.  States that for the past few weeks he has not been able to void more than a few drops at a time.  The history is provided by the patient. No language interpreter was used.      Past Medical History:  Diagnosis Date   Acute exacerbation of chronic obstructive pulmonary disease (COPD) ([place]) 01/10/2021   Alcohol abuse    Atherosclerosis of aorta ([place]) 01/10/2021   Congestive heart failure (CHF) (HCC)    Diabetes mellitus without complication ([place])    Lobar pneumonia ([place])    Lymphadenopathy 04/11/2018   Renal disorder    S/P alcohol detoxification 12/05/2013   Sepsis due to pneumonia ([place]) 04/17/2018    Patient Active Problem List   Diagnosis Date Noted   Atherosclerosis of aorta ([place]) 01/10/2021   Uninsured 01/10/2021   COPD (chronic obstructive pulmonary disease) ([place]) 01/10/2021    Pulmonary artery hypertension ([place]) 01/10/2021   Acute exacerbation of chronic obstructive pulmonary disease (COPD) ([place]) 01/10/2021   Hypervolemia    Acute on chronic diastolic congestive heart failure ([place])    Edema due to congestive heart failure ([place])    Type 2 diabetes mellitus with hyperglycemia, without long-term current use of insulin ([place]) 04/17/2018   Hypertension associated with type 2 diabetes mellitus ([place]) 04/17/2018   Sinus tachycardia 04/17/2018   Acute respiratory failure with hypoxia (HCC)    Acute on chronic respiratory failure with hypoxia ([place]) 04/11/2018   Reactive airway disease 04/11/2018   Pulmonary nodules 04/11/2018   Lymphadenopathy 04/11/2018   Alcohol use disorder, moderate, dependence ([place]) 04/11/2018   Tobacco dependence 04/11/2018   CAP (community acquired pneumonia) 04/10/2018    Past Surgical History:  Procedure Laterality Date   arm surgery     KNEE SURGERY         Family History  Adopted: Yes  Problem Relation Age of Onset   Cancer Father     Social History   Tobacco Use   Smoking status: Every Day    Packs/day: 2.00    Years: 37.00    Pack years: 74.00    Types: Cigarettes   Smokeless tobacco: Never  Substance Use Topics   Alcohol use: Yes    Alcohol/week: 56.0 standard drinks    Types: 56 Cans of beer per week    Comment: daily  Drug use: No    Home Medications Prior to Admission medications   Medication Sig Start Date End Date Taking? Authorizing Provider  albuterol (PROVENTIL HFA;VENTOLIN HFA) 108 (90 Base) MCG/ACT inhaler Inhale 2 puffs into the lungs every 6 (six) hours as needed for wheezing or shortness of breath. 04/17/18   Dhungel, Flonnie Overman, MD  blood glucose meter kit and supplies Dispense based on patient and insurance preference. Use up to four times daily as directed. (FOR ICD-10 E10.9, E11.9). 04/17/18   Dhungel, Flonnie Overman, MD  furosemide (LASIX) 80 MG tablet Take 160 mg by mouth daily.    [provider]   metFORMIN (GLUCOPHAGE) 500 MG tablet Take 1 tablet (500 mg total) by mouth 2 (two) times daily with a meal. 04/17/18 04/17/19  Dhungel, Flonnie Overman, MD  metoprolol tartrate (LOPRESSOR) 50 MG tablet Take 1 tablet (50 mg total) by mouth 2 (two) times daily. Patient not taking: Reported on 01/09/2021 04/17/18   Louellen Molder, MD    Allergies    Fire ant and Yellow jacket venom [bee venom]  Review of Systems   Review of Systems  Constitutional:  Negative for chills and fever.  HENT:  Negative for congestion, postnasal drip and rhinorrhea.   Respiratory:  Positive for cough and shortness of breath. Negative for choking, chest tightness, wheezing and stridor.   Cardiovascular:  Positive for leg swelling. Negative for chest pain and palpitations.  Gastrointestinal:  Negative for abdominal pain, diarrhea, nausea and vomiting.  Genitourinary:  Positive for decreased urine volume, difficulty urinating and scrotal swelling. Negative for dysuria, flank pain, penile discharge, testicular pain and urgency.  Musculoskeletal:  Negative for back pain and gait problem.  Neurological:  Negative for dizziness, tremors, seizures, syncope, facial asymmetry, speech difficulty, weakness, light-headedness, numbness and headaches.  Psychiatric/Behavioral:  Negative for confusion and decreased concentration.   All other systems reviewed and are negative.  Physical Exam Updated Vital Signs BP 123/65   Pulse 94   Temp (S) 98.8 F (37.1 C) (Rectal)   Resp (!) 21   SpO2 100%   Physical Exam Vitals and nursing note reviewed.  Constitutional:      General: He is not in acute distress.    Appearance: Normal appearance. He is normal weight. He is not ill-appearing, toxic-appearing or diaphoretic.  HENT:     Head: Normocephalic and atraumatic.  Eyes:     Extraocular Movements: Extraocular movements intact.     Pupils: Pupils are equal, round, and reactive to light.  Cardiovascular:     Rate and Rhythm: Normal rate  and regular rhythm.     Pulses: Normal pulses.     Heart sounds: Normal heart sounds.  Pulmonary:     Effort: Pulmonary effort is normal.     Breath sounds: Normal breath sounds. No stridor. No wheezing, rhonchi or rales.     Comments: Lungs clear to auscultation bilaterally Abdominal:     General: Abdomen is flat. Bowel sounds are normal. There is no distension.     Palpations: Abdomen is soft. There is no mass.     Tenderness: There is no abdominal tenderness. There is no right CVA tenderness, left CVA tenderness, guarding or rebound.     Hernia: No hernia is present.  Musculoskeletal:        General: Normal range of motion.     Cervical back: Normal range of motion and neck supple.     Right lower leg: Edema present.     Left lower leg: Edema present.  Comments: Bilateral 4+ pitting edema and redness extending all the way up bilateral lower extremities and into scrotum  Skin:    General: Skin is warm and dry.  Neurological:     General: No focal deficit present.     Mental Status: He is alert.  Psychiatric:        Mood and Affect: Mood normal.        Behavior: Behavior normal.    ED Results / Procedures / Treatments   Labs (all labs ordered are listed, but only abnormal results are displayed) Labs Reviewed  CBC WITH DIFFERENTIAL/PLATELET - Abnormal; Notable for the following components:      Result Value   RDW 15.8 (*)    All other components within normal limits  BRAIN NATRIURETIC PEPTIDE - Abnormal; Notable for the following components:   B Natriuretic Peptide 685.6 (*)    All other components within normal limits  URINALYSIS, ROUTINE W REFLEX MICROSCOPIC - Abnormal; Notable for the following components:   Hgb urine dipstick MODERATE (*)    Protein, ur 30 (*)    All other components within normal limits  COMPREHENSIVE METABOLIC PANEL - Abnormal; Notable for the following components:   Sodium 132 (*)    Chloride 83 (*)    CO2 38 (*)    Calcium 8.8 (*)    Albumin  3.2 (*)    Total Bilirubin 1.4 (*)    All other components within normal limits  TROPONIN I (HIGH SENSITIVITY) - Abnormal; Notable for the following components:   Troponin I (High Sensitivity) 18 (*)    All other components within normal limits  URINE CULTURE  TROPONIN I (HIGH SENSITIVITY)    EKG EKG Interpretation  Date/Time:  Tuesday January 27 2021 16:31:21 EDT Ventricular Rate:  98 PR Interval:  132 QRS Duration: 82 QT Interval:  360 QTC Calculation: 459 R Axis:   117 Text Interpretation: Sinus rhythm with occasional Premature ventricular complexes Right atrial enlargement Confirmed by Octaviano Glow [phone]) on 01/27/2021 9:47:53 PM  Radiology DG Chest 2 View  Result Date: 01/27/2021 CLINICAL DATA:  Shortness of breath. EXAM: CHEST - 2 VIEW COMPARISON:  January 09, 2021 FINDINGS: The heart size and mediastinal contours are within normal limits. Both lungs are clear. The visualized skeletal structures are unremarkable. IMPRESSION: No active cardiopulmonary disease. Electronically Signed   By: Virgina Norfolk M.D.   On: 01/27/2021 22:25    Procedures Procedures   Medications Ordered in ED Medications  furosemide (LASIX) injection 80 mg (has no administration in time range)  ipratropium-albuterol (DUONEB) 0.5-2.5 (3) MG/3ML nebulizer solution 3 mL (has no administration in time range)    ED Course  I have reviewed the triage vital signs and the nursing notes.  Pertinent labs & imaging results that were available during my care of the patient were reviewed by me and considered in my medical decision making (see chart for details).    MDM Rules/Calculators/A&P                         Patient presents today with urinary retention and shortness of breath.  Found to be satting in the low 80s on room air placed on 5 L nasal cannula.  Patient was admitted for acute hypoxic recs respiratory failure and volume overload with new oxygen requirement on 10/7 but left AMA.  He did  have an echocardiogram during his admission which showed EF of 60 to 65% and severe right ventricular enlargement.  States that he does not have home oxygen, and has not been on oxygen in the interim between now and previous admission.  Found to have 4+ pitting edema in bilateral extremities extending up into his scrotum.  Lungs are currently clear to auscultation bilaterally.  Patient states that he has been taking his home Lasix is prescribed.    Additionally, patient states has been experiencing urinary retention, only voiding a few drops at a time.  Bladder scan 186, however in the setting of needing significant diuresis, will place Foley catheter.  On reassessment, patient is now maintaining on 3L [place]. BNP found to be 685 today. Trialed patient without oxygen and dropped back into the 80s, resumed 3L [place]. Plan to diurese in the ED and admit for further diuresis and work-up. Patient is amenable with admission plan.  Hospitalist Dr. Gwendlyn Deutscher agrees to admit.   This is a shared visit with supervising physician Dr. Langston Masker who has independently evaluated patient & provided guidance in evaluation/management/disposition, in agreement with care    Final Clinical Impression(s) / ED Diagnoses Final diagnoses:  Hypoxia  Urinary retention    Rx / DC Orders ED Discharge Orders     None        Nestor Lewandowsky 01/28/21 0037    Wyvonnia Dusky, MD 01/28/21 1023

## 2021-01-27 NOTE — ED Notes (Signed)
Pt to xray

## 2021-01-28 ENCOUNTER — Inpatient Hospital Stay (HOSPITAL_COMMUNITY): Payer: Self-pay

## 2021-01-28 ENCOUNTER — Encounter (HOSPITAL_COMMUNITY): Payer: Self-pay

## 2021-01-28 DIAGNOSIS — R339 Retention of urine, unspecified: Secondary | ICD-10-CM

## 2021-01-28 DIAGNOSIS — J441 Chronic obstructive pulmonary disease with (acute) exacerbation: Secondary | ICD-10-CM | POA: Diagnosis present

## 2021-01-28 DIAGNOSIS — J9601 Acute respiratory failure with hypoxia: Secondary | ICD-10-CM

## 2021-01-28 DIAGNOSIS — R058 Other specified cough: Secondary | ICD-10-CM | POA: Insufficient documentation

## 2021-01-28 DIAGNOSIS — J9691 Respiratory failure, unspecified with hypoxia: Secondary | ICD-10-CM | POA: Diagnosis present

## 2021-01-28 DIAGNOSIS — E8779 Other fluid overload: Secondary | ICD-10-CM

## 2021-01-28 DIAGNOSIS — J9621 Acute and chronic respiratory failure with hypoxia: Secondary | ICD-10-CM

## 2021-01-28 DIAGNOSIS — R609 Edema, unspecified: Secondary | ICD-10-CM

## 2021-01-28 LAB — I-STAT ARTERIAL BLOOD GAS, ED
Acid-Base Excess: 15 mmol/L — ABNORMAL HIGH (ref 0.0–2.0)
Bicarbonate: 48.6 mmol/L — ABNORMAL HIGH (ref 20.0–28.0)
Calcium, Ion: 1.16 mmol/L (ref 1.15–1.40)
HCT: 49 % (ref 39.0–52.0)
Hemoglobin: 16.7 g/dL (ref 13.0–17.0)
O2 Saturation: 99 %
Patient temperature: 97.6
Potassium: 3.8 mmol/L (ref 3.5–5.1)
Sodium: 133 mmol/L — ABNORMAL LOW (ref 135–145)
TCO2: >50 mmol/L — ABNORMAL HIGH (ref 22–32)
pCO2 arterial: 106.9 mmHg (ref 32.0–48.0)
pH, Arterial: 7.263 — ABNORMAL LOW (ref 7.350–7.450)
pO2, Arterial: 148 mmHg — ABNORMAL HIGH (ref 83.0–108.0)

## 2021-01-28 LAB — I-STAT VENOUS BLOOD GAS, ED
Acid-Base Excess: 18 mmol/L — ABNORMAL HIGH (ref 0.0–2.0)
Bicarbonate: 46.3 mmol/L — ABNORMAL HIGH (ref 20.0–28.0)
Calcium, Ion: 0.91 mmol/L — ABNORMAL LOW (ref 1.15–1.40)
HCT: 51 % (ref 39.0–52.0)
Hemoglobin: 17.3 g/dL — ABNORMAL HIGH (ref 13.0–17.0)
O2 Saturation: 100 %
Potassium: 4.8 mmol/L (ref 3.5–5.1)
Sodium: 130 mmol/L — ABNORMAL LOW (ref 135–145)
TCO2: 48 mmol/L — ABNORMAL HIGH (ref 22–32)
pCO2, Ven: 61.4 mmHg — ABNORMAL HIGH (ref 44.0–60.0)
pH, Ven: 7.486 — ABNORMAL HIGH (ref 7.250–7.430)
pO2, Ven: 171 mmHg — ABNORMAL HIGH (ref 32.0–45.0)

## 2021-01-28 LAB — URINE CULTURE: Culture: NO GROWTH

## 2021-01-28 LAB — GLUCOSE, CAPILLARY
Glucose-Capillary: 104 mg/dL — ABNORMAL HIGH (ref 70–99)
Glucose-Capillary: 131 mg/dL — ABNORMAL HIGH (ref 70–99)
Glucose-Capillary: 165 mg/dL — ABNORMAL HIGH (ref 70–99)
Glucose-Capillary: 205 mg/dL — ABNORMAL HIGH (ref 70–99)

## 2021-01-28 LAB — BLOOD GAS, ARTERIAL
Acid-Base Excess: 16.2 mmol/L — ABNORMAL HIGH (ref 0.0–2.0)
Bicarbonate: 44 mmol/L — ABNORMAL HIGH (ref 20.0–28.0)
FIO2: 50
O2 Saturation: 93.4 %
Patient temperature: 37
pCO2 arterial: 107 mmHg (ref 32.0–48.0)
pH, Arterial: 7.239 — ABNORMAL LOW (ref 7.350–7.450)
pO2, Arterial: 79.1 mmHg — ABNORMAL LOW (ref 83.0–108.0)

## 2021-01-28 LAB — COMPREHENSIVE METABOLIC PANEL
ALT: 10 U/L (ref 0–44)
AST: 44 U/L — ABNORMAL HIGH (ref 15–41)
Albumin: 3 g/dL — ABNORMAL LOW (ref 3.5–5.0)
Alkaline Phosphatase: 105 U/L (ref 38–126)
Anion gap: 6 (ref 5–15)
BUN: 13 mg/dL (ref 6–20)
CO2: 40 mmol/L — ABNORMAL HIGH (ref 22–32)
Calcium: 8.4 mg/dL — ABNORMAL LOW (ref 8.9–10.3)
Chloride: 84 mmol/L — ABNORMAL LOW (ref 98–111)
Creatinine, Ser: 0.69 mg/dL (ref 0.61–1.24)
GFR, Estimated: >60 mL/min (ref >60)
Glucose, Bld: 125 mg/dL — ABNORMAL HIGH (ref 70–99)
Potassium: 4.6 mmol/L (ref 3.5–5.1)
Sodium: 130 mmol/L — ABNORMAL LOW (ref 135–145)
Total Bilirubin: 1.8 mg/dL — ABNORMAL HIGH (ref 0.3–1.2)
Total Protein: 6 g/dL — ABNORMAL LOW (ref 6.5–8.1)

## 2021-01-28 LAB — RESP PANEL BY RT-PCR (FLU A&B, COVID) ARPGX2
Influenza A by PCR: NEGATIVE
Influenza B by PCR: NEGATIVE
SARS Coronavirus 2 by RT PCR: NEGATIVE

## 2021-01-28 LAB — LIPID PANEL
Cholesterol: 125 mg/dL (ref 0–200)
HDL: 37 mg/dL — ABNORMAL LOW (ref >40)
LDL Cholesterol: 78 mg/dL (ref 0–99)
Total CHOL/HDL Ratio: 3.4 RATIO
Triglycerides: 48 mg/dL (ref <150)
VLDL: 10 mg/dL (ref 0–40)

## 2021-01-28 LAB — CBG MONITORING, ED: Glucose-Capillary: 229 mg/dL — ABNORMAL HIGH (ref 70–99)

## 2021-01-28 LAB — LACTIC ACID, PLASMA
Lactic Acid, Venous: 1.6 mmol/L (ref 0.5–1.9)
Lactic Acid, Venous: 0.9 mmol/L (ref 0.5–1.9)

## 2021-01-28 LAB — PROCALCITONIN: Procalcitonin: <0.1 ng/mL

## 2021-01-28 LAB — I-STAT BETA HCG BLOOD, ED (MC, WL, AP ONLY): I-stat hCG, quantitative: <5 m[IU]/mL (ref <5)

## 2021-01-28 LAB — TSH: TSH: 2.451 u[IU]/mL (ref 0.350–4.500)

## 2021-01-28 LAB — SALICYLATE LEVEL: Salicylate Lvl: <7 mg/dL — ABNORMAL LOW (ref 7.0–30.0)

## 2021-01-28 LAB — ETHANOL: Alcohol, Ethyl (B): <10 mg/dL (ref <10)

## 2021-01-28 LAB — MRSA NEXT GEN BY PCR, NASAL: MRSA by PCR Next Gen: NOT DETECTED

## 2021-01-28 MED ORDER — LIDOCAINE HCL URETHRAL/MUCOSAL 2 % EX GEL
1.0000 "application " | Freq: Once | CUTANEOUS | Status: AC
  Administered 2021-01-28: 1 via TOPICAL
  Filled 2021-01-28: qty 11

## 2021-01-28 MED ORDER — LORAZEPAM 1 MG PO TABS: 1.0000 mg | ORAL_TABLET | ORAL | Status: DC | PRN

## 2021-01-28 MED ORDER — THIAMINE HCL 100 MG/ML IJ SOLN
100.0000 mg | Freq: Every day | INTRAMUSCULAR | Status: DC
  Administered 2021-01-28 – 2021-01-30 (×2): 100 mg via INTRAVENOUS
  Filled 2021-01-28 (×2): qty 2

## 2021-01-28 MED ORDER — AZITHROMYCIN 250 MG PO TABS: 500.0000 mg | ORAL_TABLET | Freq: Every day | ORAL | Status: DC

## 2021-01-28 MED ORDER — THIAMINE HCL 100 MG PO TABS
100.0000 mg | ORAL_TABLET | Freq: Every day | ORAL | Status: DC
  Administered 2021-01-29 – 2021-02-04 (×6): 100 mg via ORAL
  Filled 2021-01-28 (×7): qty 1

## 2021-01-28 MED ORDER — THIAMINE HCL 100 MG PO TABS: 100.0000 mg | ORAL_TABLET | Freq: Every day | ORAL | Status: DC

## 2021-01-28 MED ORDER — INSULIN ASPART 100 UNIT/ML IJ SOLN
0.0000 [IU] | INTRAMUSCULAR | Status: DC
  Administered 2021-01-28: 2 [IU] via SUBCUTANEOUS
  Administered 2021-01-28 – 2021-01-30 (×5): 1 [IU] via SUBCUTANEOUS
  Administered 2021-01-30 – 2021-01-31 (×3): 2 [IU] via SUBCUTANEOUS
  Administered 2021-02-01 – 2021-02-02 (×4): 1 [IU] via SUBCUTANEOUS

## 2021-01-28 MED ORDER — FOLIC ACID 1 MG PO TABS
1.0000 mg | ORAL_TABLET | Freq: Every day | ORAL | Status: DC
  Administered 2021-01-29 – 2021-02-04 (×7): 1 mg via ORAL
  Filled 2021-01-28 (×7): qty 1

## 2021-01-28 MED ORDER — CHLORHEXIDINE GLUCONATE CLOTH 2 % EX PADS
6.0000 | MEDICATED_PAD | Freq: Every day | CUTANEOUS | Status: DC
  Administered 2021-01-28 – 2021-02-04 (×8): 6 via TOPICAL

## 2021-01-28 MED ORDER — IPRATROPIUM-ALBUTEROL 0.5-2.5 (3) MG/3ML IN SOLN
3.0000 mL | RESPIRATORY_TRACT | Status: DC
  Administered 2021-01-28 – 2021-01-29 (×8): 3 mL via RESPIRATORY_TRACT
  Filled 2021-01-28 (×7): qty 3

## 2021-01-28 MED ORDER — METHYLPREDNISOLONE SODIUM SUCC 40 MG IJ SOLR: 40.0000 mg | Freq: Two times a day (BID) | INTRAMUSCULAR | Status: DC

## 2021-01-28 MED ORDER — LORAZEPAM 2 MG/ML IJ SOLN
1.0000 mg | INTRAMUSCULAR | Status: DC | PRN
  Administered 2021-01-28: 2 mg via INTRAVENOUS
  Filled 2021-01-28: qty 1

## 2021-01-28 MED ORDER — FUROSEMIDE 10 MG/ML IJ SOLN
120.0000 mg | Freq: Once | INTRAVENOUS | Status: AC
  Administered 2021-01-28: 120 mg via INTRAVENOUS
  Filled 2021-01-28: qty 10

## 2021-01-28 MED ORDER — POLYETHYLENE GLYCOL 3350 17 G PO PACK: 17.0000 g | PACK | Freq: Every day | ORAL | Status: DC | PRN

## 2021-01-28 MED ORDER — ENOXAPARIN SODIUM 40 MG/0.4ML IJ SOSY
40.0000 mg | PREFILLED_SYRINGE | Freq: Every day | INTRAMUSCULAR | Status: DC
  Administered 2021-01-28 – 2021-01-29 (×2): 40 mg via SUBCUTANEOUS
  Filled 2021-01-28 (×2): qty 0.4

## 2021-01-28 MED ORDER — NICOTINE 21 MG/24HR TD PT24: 21.0000 mg | MEDICATED_PATCH | Freq: Every day | TRANSDERMAL | Status: DC

## 2021-01-28 MED ORDER — ADULT MULTIVITAMIN W/MINERALS CH
1.0000 | ORAL_TABLET | Freq: Every day | ORAL | Status: DC
  Administered 2021-01-29 – 2021-02-04 (×7): 1 via ORAL
  Filled 2021-01-28 (×7): qty 1

## 2021-01-28 MED ORDER — METHYLPREDNISOLONE SODIUM SUCC 125 MG IJ SOLR
125.0000 mg | Freq: Every day | INTRAMUSCULAR | Status: DC
  Administered 2021-01-28: 125 mg via INTRAVENOUS
  Filled 2021-01-28: qty 2

## 2021-01-28 NOTE — Progress Notes (Signed)
BLE venous duplex has been completed.   Results can be found under chart review under CV PROC. 01/28/2021 4:52 PM Madoc Holquin RVT, RDMS

## 2021-01-28 NOTE — ED Notes (Signed)
Despite being educated multiple times pt continues to get out of bed and not use call bell and yells inside of room. Pt yelling inside of room that he needs to use restroom. Pt sat on bedside commode and educated again on use of call bell. Pt got off of bedside commode, did not use call bell, urine and feces in the floor. Pt back in bed and on monitor at this time

## 2021-01-28 NOTE — ED Notes (Addendum)
This RN and Copywriter, advertising attempted to insert coude cathether - unsuccessful - MD Alvie Heidelberg notified

## 2021-01-28 NOTE — ED Notes (Signed)
MD at bedside

## 2021-01-28 NOTE — ED Notes (Signed)
Pt continuing to take O2 off and desatting - pt educated on importance of oxygen

## 2021-01-28 NOTE — ED Notes (Signed)
Istat results in chart MD notified

## 2021-01-28 NOTE — ED Notes (Signed)
This RN enters room to meet patient and assist RT with bipap application. Patient refusing bipap, pulling off face. Coarse language. Patient agrees to try bipap again after breakfast. Meal at bedside. Placed back on [place] @ 3LPM.

## 2021-01-28 NOTE — ED Notes (Signed)
Per MD Alvie Heidelberg okay to hold off on foley insertion at this time - keep close I&Os

## 2021-01-28 NOTE — ED Notes (Signed)
Pt woke up, sitting on edge of bed, seems to be confused, but able to answer orientation questions. Labs drawn and RT called to collect ABG - MD notified of pt behavior

## 2021-01-28 NOTE — ED Notes (Signed)
Night team notified that pt is able to answer orientation questions but then goes off on tangents, mumbling to himself, talking about stuff in the room.. could just be his normal though?Marland Kitchen..VBG collected

## 2021-01-28 NOTE — Progress Notes (Signed)
RT attempted bipap. Patient refusing mask and says "he isn't going to wear that". RN made aware.

## 2021-01-28 NOTE — Progress Notes (Signed)
PT Cancellation Note  Patient Details Name: GREENE DIODATO MRN: [id number] DOB: [birth year]   Cancelled Treatment:    Reason Eval/Treat Not Completed: Medical issues which prohibited therapy Pt currently on BiPap and not appropriate for PT at this time. Will hold and follow up as schedule allows.   Reuel Derby, PT, DPT  Acute Rehabilitation Services  Pager: [phone] Office: [phone]    Rudean Hitt 01/28/2021, 10:26 AM

## 2021-01-28 NOTE — Progress Notes (Signed)
OT Cancellation Note  Patient Details Name: Alex Martinez MRN: [id number] DOB: 1965-04-13   Cancelled Treatment:    Reason Eval/Treat Not Completed: Patient not medically ready.  Medical issues which prohibited therapy Pt currently on BiPap and not appropriate for OT at this time. Will hold and follow up as schedule allows.   Makalyn Lennox D Dontrez Pettis 01/28/2021, 11:08 AM

## 2021-01-28 NOTE — ED Notes (Signed)
Admitting MDs at bedside.

## 2021-01-28 NOTE — Consult Note (Addendum)
NAME:  Alex Martinez, MRN:  [id number], DOB:  Aug 03, 1965, LOS: 0 ADMISSION DATE:  01/27/2021, CONSULTATION DATE: 01/28/2021 REFERRING MD: Teaching service, CHIEF COMPLAINT: Hypercarbic respiratory failure  History of Present Illness:  55 year old male longstanding history of alcohol abuse poor compliance recently seen in the hospital and left prior to receiving full care.  Returned with altered mental status hypercarbic respiratory failure requiring noninvasive mechanical ventilatory support.  Past medical history is well-documented below.  Critical care called to bedside for sedation and hypercarbic respiratory failure creased level of consciousness.  Found to be obtunded and was transferred to the intensive care unit and to the pulmonary critical care service.  There is a good chance he will require intubation before this is over.  Further work-up being instituted at this time.  Pertinent  Medical History   Past Medical History:  Diagnosis Date   Acute exacerbation of chronic obstructive pulmonary disease (COPD) ([place]) 01/10/2021   Alcohol abuse    Atherosclerosis of aorta ([place]) 01/10/2021   Congestive heart failure (CHF) (HCC)    Diabetes mellitus without complication ([place])    Lobar pneumonia ([place])    Lymphadenopathy 04/11/2018   Renal disorder    S/P alcohol detoxification 12/05/2013   Sepsis due to pneumonia ([place]) 04/17/2018     Significant Hospital Events: Including procedures, antibiotic start and stop dates in addition to other pertinent events   01/28/2021 pulmonary critical care consult transferred to intensive care and to PCCM service  Interim History / Subjective:  Poorly responsive alcoholic call abuse requiring noninvasive mechanical ventilatory support possible intubation  Objective   Blood pressure 117/70, pulse (!) 107, temperature 98.5 F (36.9 C), temperature source Oral, resp. rate 17, SpO2 93 %.        Intake/Output Summary (Last 24 hours) at 01/28/2021  1002 Last data filed at 01/28/2021 0649 Gross per 24 hour  Intake --  Output 1335 ml  Net -1335 ml   There were no vitals filed for this visit.  Examination: General: Middle-age male who older than stated age.  Currently obtunded and poorly response HENT: Edema lymphadenopathy is appreciated.  Full facemask BiPAP is in place Lungs: Rhonchi decreased breath sounds in the bases Cardiovascular: Sounds are distant but regular Abdomen: Soft nontender positive bowel Extremities: 2-3+ edema with markings from chronic lower extremity injuries Neuro: Has been awake but now obtunded following 2 mg Ativan GU: No urine is been observed at this time.  3+ scrotal edema is noted        Resolved Hospital Problem list     Assessment & Plan:  Acute on chronic hypercarbic respiratory failure in the setting of COPD with continued tobacco abuse and poor compliance.  In the setting of chronic alcohol abuse. Admit to the intensive care unit Noninvasive mechanical ventilatory support as tolerated May need intubation Intensive care monitoring Hold off on antibiotics at this time [place]  Chronic alcohol abuse CIWA protocol Observe for withdrawal Check toxicology screen  RV dysfunction Diuresis as tolerated Monitor electrolytes  Polysubstance abuse CIWA protocol Thiamine folic acid Multivitamins Close observation intensive care unit   Scrotal edema Ultrasound examination of scrotum   Best Practice (right click and "Reselect all SmartList Selections" daily)   Diet/type: NPO DVT prophylaxis: LMWH GI prophylaxis: N/A Lines: N/A Foley:  N/A Code Status:  full code Last date of multidisciplinary goals of care discussion [TBD]  Labs   CBC: Recent Labs  Lab 01/27/21 1701 01/28/21 0554 01/28/21 0707  WBC 7.1  --   --  NEUTROABS 5.1  --   --   HGB 15.1 17.3* 16.7  HCT 49.1 51.0 49.0  MCV 97.4  --   --   PLT 294  --   --     Basic Metabolic Panel: Recent Labs  Lab  01/27/21 1810 01/28/21 0443 01/28/21 0554 01/28/21 0707  NA 132* 130* 130* 133*  K 4.1 4.6 4.8 3.8  CL 83* 84*  --   --   CO2 38* 40*  --   --   GLUCOSE 99 125*  --   --   BUN 16 13  --   --   CREATININE 0.78 0.69  --   --   CALCIUM 8.8* 8.4*  --   --    GFR: CrCl cannot be calculated (Unknown ideal weight.). Recent Labs  Lab 01/27/21 1701  WBC 7.1    Liver Function Tests: Recent Labs  Lab 01/27/21 1810 01/28/21 0443  AST 26 44*  ALT 15 10  ALKPHOS 97 105  BILITOT 1.4* 1.8*  PROT 6.8 6.0*  ALBUMIN 3.2* 3.0*   No results for input(s): LIPASE, AMYLASE in the last 168 hours. No results for input(s): AMMONIA in the last 168 hours.  ABG    Component Value Date/Time   PHART 7.263 (L) 01/28/2021 0707   PCO2ART 106.9 (HH) 01/28/2021 0707   PO2ART 148 (H) 01/28/2021 0707   HCO3 48.6 (H) 01/28/2021 0707   TCO2 >50 (H) 01/28/2021 0707   O2SAT 99.0 01/28/2021 0707     Coagulation Profile: No results for input(s): INR, PROTIME in the last 168 hours.  Cardiac Enzymes: No results for input(s): CKTOTAL, CKMB, CKMBINDEX, TROPONINI in the last 168 hours.  HbA1C: Hgb A1c MFr Bld  Date/Time Value Ref Range Status  01/10/2021 03:35 AM 6.7 (H) 4.8 - 5.6 % Final    Comment:    (NOTE) Pre diabetes:          5.7%-6.4%  Diabetes:              >6.4%  Glycemic control for   <7.0% adults with diabetes   04/11/2018 05:02 AM 7.7 (H) 4.8 - 5.6 % Final    Comment:    (NOTE) Pre diabetes:          5.7%-6.4% Diabetes:              >6.4% Glycemic control for   <7.0% adults with diabetes     CBG: Recent Labs  Lab 01/28/21 0759  GLUCAP 229*    Review of Systems:   na  Past Medical History:  He,  has a past medical history of Acute exacerbation of chronic obstructive pulmonary disease (COPD) ([place]) (01/10/2021), Alcohol abuse, Atherosclerosis of aorta ([place]) (01/10/2021), Congestive heart failure (CHF) ([place]), Diabetes mellitus without complication ([place]), Lobar pneumonia  ([place]), Lymphadenopathy (04/11/2018), Renal disorder, S/P alcohol detoxification (12/05/2013), and Sepsis due to pneumonia ([place]) (04/17/2018).   Surgical History:   Past Surgical History:  Procedure Laterality Date   arm surgery     KNEE SURGERY       Social History:   reports that he has been smoking cigarettes. He has a 74.00 pack-year smoking history. He has never used smokeless tobacco. He reports current alcohol use of about 56.0 standard drinks per week. He reports that he does not use drugs.   Family History:  His family history includes Cancer in his father. He was adopted.   Allergies Allergies  Allergen Reactions   Museum/gallery curator and Other (See  Comments)    Shaking, starts sweating really bad   Yellow Jacket Venom [Bee Venom] Swelling     Home Medications  Prior to Admission medications   Medication Sig Start Date End Date Taking? Authorizing Provider  albuterol (PROVENTIL HFA;VENTOLIN HFA) 108 (90 Base) MCG/ACT inhaler Inhale 2 puffs into the lungs every 6 (six) hours as needed for wheezing or shortness of breath. 04/17/18  Yes Dhungel, Flonnie Overman, MD  blood glucose meter kit and supplies Dispense based on patient and insurance preference. Use up to four times daily as directed. (FOR ICD-10 E10.9, E11.9). 04/17/18   Dhungel, Flonnie Overman, MD  furosemide (LASIX) 80 MG tablet Take 160 mg by mouth daily.    [provider]  metFORMIN (GLUCOPHAGE) 500 MG tablet Take 1 tablet (500 mg total) by mouth 2 (two) times daily with a meal. 04/17/18 04/17/19  Dhungel, Flonnie Overman, MD  metoprolol tartrate (LOPRESSOR) 50 MG tablet Take 1 tablet (50 mg total) by mouth 2 (two) times daily. Patient not taking: Reported on 01/09/2021 04/17/18   Louellen Molder, MD     Critical care time: 61 min     Richardson Landry Jaysun Wessels ACNP Acute Care Nurse Practitioner [place] Please consult Amion 01/28/2021, 10:02 AM

## 2021-01-28 NOTE — H&P (Addendum)
[facility] Admission History and Physical Service Pager: [phone]  Patient name: Alex Martinez Medical record number: [id number] Date of birth: [birth year] Age: 55 y.o. Gender: male  Primary Care Provider: Leonard Downing, MD Consultants: None Code Status: Full Code  Preferred Emergency Contact: Dr. Arelia Sneddon [phone]  Chief Complaint: inability to urinate  Assessment and Plan: Alex Martinez is a 55 y.o. male presenting with SOB and urinary retention. PMH is significant for COPD, Right sided HF (cor pulmonale?), T2DM, alcohol use disorder, tobacco use disorder.    Acute hypoxic respiratory failure  COPD  Right sided HF Patient has reported hx of COPD (never officially diagnosed) and right sided heart failure (echo from 10/08 showing severely enlarged RV, severe RAE, normal EF), perhaps cor pulmonale. No records from cardiology in the chart. His home medication regimen consists of Lasix 160 mg daily and an albuterol inhaler. He presented with acute on chronic dyspnea and had sats in the mid 80's per ED provider (not charted), requiring 5L [place] (required 3 to remain 95% and above during exam). He reports constant chronic cough and sputum production, with no recent changes. He reports significant weight gain and increasing lower extremity edema over the past several weeks. On exam he has prominent wheezes with a prolonged expiratory phase as well as 2+ pitting edema to the proximal tibia and in his hips. He desats quickly when taken off O2. Workup notable for a BNP of 686 and an unremarkable CXR, no leukocytosis and afebrile. His current presentation is most consistent with a COPD exacerbation despite no changes in sputum and cough recently. He does still have significant volume overload with his right heart failure. 2020 CT of the chest was notable for multiple pulmonary nodules and LAD, could consider follow up chest CT given continued pulmonary  complaints and hypoxia in setting of right heart failure.  - admit to medical telemetry, attending Dr. Gwendlyn Deutscher  -Solumedrol 125 mg IV x1 -Azithromycin 500 mg x3 days -Duonebs q4hrs -Lasix 120 mg IV x1 -will obtain ABG  -Strict I&O's - consider f/u repeat chest CT  -Daily weights -Up with assistance -PT/OT -VS per floor protocol    Urinary complaints, possible BPH Patient presented with complaints regarding urine output. He reports a subjective sensation of suprapubic fullness, as well as hesitancy, and feels he is retaining urine. He has significant scrotal edema and reports that when he does urinate, he is unable to control where it goes. No suprapubic fullness/tenderness elicited on exam and a bladder scan showed 180 mL. Attempted to insert both a foley and coude catheter, without success. High index of suspicion for untreated BPH, however, prostate exam was unremarkable. -Monitor UOP -Consider urology consult -Consider Flomax/finasteride   T2DM Glc of 99 on admission. Home med of metformin 500 mg BID.  -CBGs QID -Hold home metformin  Tobacco use disorder Patient reports smoking 1 PPD.  -nicotine patch  Alcohol use disorder Patient reports drinking 2-3 42 oz containers of liquor daily up until 4 weeks ago when he stopped drinking.  -Multivitamin and thiamine  FEN/GI: carb modified/heart healthy   Prophylaxis: Lovenox  Disposition: med tele   History of Present Illness:  Alex Martinez is a 55 y.o. male presenting with urinary retention and SOB.  Patient states he was unable to urinate and he tried to go to Dr. Welton Flakes for this and they recommended that he be seen in the ED. He states that he feels that his prostate is fully. He  states that he has to hurry into the bathroom at night. He is also having swelling in his penis and legs to the point he can not sit on the toilet. He reports straining to urinate and that has been happening for 3-4 weeks. He reports that he can  only urinate 2x per day. He reports that if he does not eat, he urinates more frequently after taking his pills (every 20-45 mins)  He reports that he has been having difficulty breathing for 8-9 years. He reports that he thinks this is related to his long history of smoking. He states he does not currently feel SOB at the moment and requests to have oxygen turned off. (Of note, he desaturates and has SOB when turned down to RA) He is unclear if he has ever been diagnosed with SOB. He states that he last weighed 247 lbs and has not been weighed again but believes this is very high for him. He endorses increased LE swelling. He reports using albuterol inhaler, lasix (takes 4 pills reports that he can take up to 7 pills per day, currently takes all four at one time). He last took lasix at 1200-1300 on 10/25. He reports that he often misses his lasix when he has a depressed mood. One week ago, he reports having decreased scrotal swelling.   He reports increased sputum production with his cough and states it has been going on in the last 7 years.   Patient reports that he has not had alcohol in 4 weeks. He states that he used to drink 2-3 42oz bottles of alcohol daily. He currently smokes 1PPD. He denies using recreational drugs.   Review Of Systems: Per HPI with the following additions:   Review of Systems  Constitutional:  Positive for unexpected weight change. Negative for appetite change, chills and fever.       Weight gain  Respiratory:  Positive for cough and shortness of breath.        +sputum production  Cardiovascular:  Positive for leg swelling. Negative for chest pain.  Gastrointestinal:  Positive for constipation. Negative for abdominal pain, anal bleeding, blood in stool and diarrhea.  Genitourinary:  Positive for decreased urine volume, difficulty urinating, penile swelling and scrotal swelling.    Patient Active Problem List   Diagnosis Date Noted   Respiratory failure, unspecified  with hypoxia ([place]) 01/28/2021   Atherosclerosis of aorta ([place]) 01/10/2021   Uninsured 01/10/2021   COPD (chronic obstructive pulmonary disease) ([place]) 01/10/2021   Pulmonary artery hypertension ([place]) 01/10/2021   Acute exacerbation of chronic obstructive pulmonary disease (COPD) ([place]) 01/10/2021   Hypervolemia    Acute on chronic diastolic congestive heart failure ([place])    Edema due to congestive heart failure ([place])    Type 2 diabetes mellitus with hyperglycemia, without long-term current use of insulin ([place]) 04/17/2018   Hypertension associated with type 2 diabetes mellitus ([place]) 04/17/2018   Sinus tachycardia 04/17/2018   Acute respiratory failure with hypoxia ([place])    Acute on chronic respiratory failure with hypoxia ([place]) 04/11/2018   Reactive airway disease 04/11/2018   Pulmonary nodules 04/11/2018   Lymphadenopathy 04/11/2018   Alcohol use disorder, moderate, dependence ([place]) 04/11/2018   Tobacco dependence 04/11/2018   CAP (community acquired pneumonia) 04/10/2018    Past Medical History: Past Medical History:  Diagnosis Date   Acute exacerbation of chronic obstructive pulmonary disease (COPD) ([place]) 01/10/2021   Alcohol abuse    Atherosclerosis of aorta ([place]) 01/10/2021   Congestive heart failure (  CHF) ([place])    Diabetes mellitus without complication ([place])    Lobar pneumonia ([place])    Lymphadenopathy 04/11/2018   Renal disorder    S/P alcohol detoxification 12/05/2013   Sepsis due to pneumonia ([place]) 04/17/2018    Past Surgical History: Past Surgical History:  Procedure Laterality Date   arm surgery     KNEE SURGERY      Social History: Social History   Tobacco Use   Smoking status: Every Day    Packs/day: 2.00    Years: 37.00    Pack years: 74.00    Types: Cigarettes   Smokeless tobacco: Never  Substance Use Topics   Alcohol use: Yes    Alcohol/week: 56.0 standard drinks    Types: 56 Cans of beer per week    Comment: daily   Drug use: No    Please also refer  to relevant sections of EMR.  Family History: Family History  Adopted: Yes  Problem Relation Age of Onset   Cancer Father     Allergies and Medications: Allergies  Allergen Reactions   [employer] and Other (See Comments)    Shaking, starts sweating really bad   Yellow Jacket Venom [Bee Venom] Swelling   No current facility-administered medications on file prior to encounter.   Current Outpatient Medications on File Prior to Encounter  Medication Sig Dispense Refill   albuterol (PROVENTIL HFA;VENTOLIN HFA) 108 (90 Base) MCG/ACT inhaler Inhale 2 puffs into the lungs every 6 (six) hours as needed for wheezing or shortness of breath. 1 Inhaler 2   blood glucose meter kit and supplies Dispense based on patient and insurance preference. Use up to four times daily as directed. (FOR ICD-10 E10.9, E11.9). 1 each 0   furosemide (LASIX) 80 MG tablet Take 160 mg by mouth daily.     metFORMIN (GLUCOPHAGE) 500 MG tablet Take 1 tablet (500 mg total) by mouth 2 (two) times daily with a meal. 60 tablet 0   metoprolol tartrate (LOPRESSOR) 50 MG tablet Take 1 tablet (50 mg total) by mouth 2 (two) times daily. (Patient not taking: Reported on 01/09/2021) 60 tablet 0    Objective: BP (!) 126/91   Pulse 93   Temp (S) 98.8 F (37.1 C) (Rectal)   Resp (!) 21   SpO2 100%   Physical Exam Vitals reviewed.  Cardiovascular:     Rate and Rhythm: Normal rate and regular rhythm.     Heart sounds: No murmur heard.    Comments: 2+ pitting edema to the proximal tibia and in his hips Pulmonary:     Comments: Increased work of breathing, prominent wheezes with a prolonged expiratory phase Abdominal:     General: Bowel sounds are normal.     Palpations: Abdomen is soft.     Tenderness: There is no abdominal tenderness.  Neurological:     Mental Status: He is alert and oriented to person, place, and time.     Labs and Imaging: CBC BMET  Recent Labs  Lab 01/27/21 1701  WBC 7.1  HGB 15.1  HCT  49.1  PLT 294   Recent Labs  Lab 01/27/21 1810  NA 132*  K 4.1  CL 83*  CO2 38*  BUN 16  CREATININE 0.78  GLUCOSE 99  CALCIUM 8.8*     EKG: sinus rhythm, no ST changes noted   Corky Sox, MD PGY-1 [facility] pager: [phone], text pages welcome   FPTS Upper-Level Resident Addendum   I have independently interviewed  and examined the patient. I have discussed the above with [place] and agree with the documented plan. My edits for correction/addition/clarification are included above. Please see any attending notes.   Eulis Foster, MD PGY-3, [facility] 01/28/2021 6:14 AM  FPTS Service pager: [phone] (text pages welcome through [facility])

## 2021-01-29 LAB — GLUCOSE, CAPILLARY
Glucose-Capillary: 90 mg/dL (ref 70–99)
Glucose-Capillary: 87 mg/dL (ref 70–99)
Glucose-Capillary: 100 mg/dL — ABNORMAL HIGH (ref 70–99)
Glucose-Capillary: 187 mg/dL — ABNORMAL HIGH (ref 70–99)
Glucose-Capillary: 170 mg/dL — ABNORMAL HIGH (ref 70–99)

## 2021-01-29 LAB — RESPIRATORY PANEL BY PCR

## 2021-01-29 LAB — CBC
HCT: 46.5 % (ref 39.0–52.0)
Hemoglobin: 13.8 g/dL (ref 13.0–17.0)
MCH: 29.7 pg (ref 26.0–34.0)
MCHC: 29.7 g/dL — ABNORMAL LOW (ref 30.0–36.0)
MCV: 100.2 fL — ABNORMAL HIGH (ref 80.0–100.0)
Platelets: 263 10*3/uL (ref 150–400)
RBC: 4.64 MIL/uL (ref 4.22–5.81)
RDW: 15.5 % (ref 11.5–15.5)
WBC: 8 10*3/uL (ref 4.0–10.5)
nRBC: 0 % (ref 0.0–0.2)

## 2021-01-29 LAB — BASIC METABOLIC PANEL
BUN: 10 mg/dL (ref 6–20)
CO2: >45 mmol/L — ABNORMAL HIGH (ref 22–32)
Calcium: 9.1 mg/dL (ref 8.9–10.3)
Chloride: 86 mmol/L — ABNORMAL LOW (ref 98–111)
Creatinine, Ser: 0.65 mg/dL (ref 0.61–1.24)
GFR, Estimated: >60 mL/min (ref >60)
Glucose, Bld: 91 mg/dL (ref 70–99)
Potassium: 4.5 mmol/L (ref 3.5–5.1)
Sodium: 136 mmol/L (ref 135–145)

## 2021-01-29 LAB — MAGNESIUM: Magnesium: 2 mg/dL (ref 1.7–2.4)

## 2021-01-29 MED ORDER — ENOXAPARIN SODIUM 60 MG/0.6ML IJ SOSY
60.0000 mg | PREFILLED_SYRINGE | Freq: Every day | INTRAMUSCULAR | Status: DC
  Administered 2021-01-30 – 2021-02-01 (×3): 60 mg via SUBCUTANEOUS
  Filled 2021-01-29 (×3): qty 0.6

## 2021-01-29 MED ORDER — LORAZEPAM 0.5 MG PO TABS
0.5000 mg | ORAL_TABLET | Freq: Two times a day (BID) | ORAL | Status: AC | PRN
  Administered 2021-01-29: 0.5 mg via ORAL
  Administered 2021-01-31: 1 mg via ORAL
  Filled 2021-01-29: qty 1
  Filled 2021-01-29: qty 2

## 2021-01-29 MED ORDER — PREDNISONE 20 MG PO TABS
40.0000 mg | ORAL_TABLET | Freq: Every day | ORAL | Status: AC
  Administered 2021-01-29 – 2021-01-31 (×3): 40 mg via ORAL
  Filled 2021-01-29 (×3): qty 2

## 2021-01-29 MED ORDER — IPRATROPIUM-ALBUTEROL 0.5-2.5 (3) MG/3ML IN SOLN
3.0000 mL | Freq: Two times a day (BID) | RESPIRATORY_TRACT | Status: DC
  Administered 2021-01-29 – 2021-02-01 (×6): 3 mL via RESPIRATORY_TRACT
  Filled 2021-01-29 (×6): qty 3

## 2021-01-29 MED ORDER — ACETAZOLAMIDE 250 MG PO TABS
500.0000 mg | ORAL_TABLET | Freq: Two times a day (BID) | ORAL | Status: AC
  Administered 2021-01-29 – 2021-01-30 (×4): 500 mg via ORAL
  Filled 2021-01-29 (×4): qty 2

## 2021-01-29 MED ORDER — LIDOCAINE HCL URETHRAL/MUCOSAL 2 % EX GEL
1.0000 "application " | Freq: Once | CUTANEOUS | Status: AC
  Administered 2021-01-29: 1 via URETHRAL
  Filled 2021-01-29 (×2): qty 6

## 2021-01-29 MED ORDER — FUROSEMIDE 10 MG/ML IJ SOLN
60.0000 mg | Freq: Two times a day (BID) | INTRAMUSCULAR | Status: DC
  Administered 2021-01-29 – 2021-02-02 (×8): 60 mg via INTRAVENOUS
  Filled 2021-01-29 (×8): qty 6

## 2021-01-29 NOTE — Progress Notes (Signed)
Was called to place coude catheter.2 attempts were made by 2 nurses, no success. Unable to place coude catheter, very strong resistance. Pt's nurse was made aware and suggested to call on call urologist. Currently pt's nurse Brain is on the phone with the doctor on call.

## 2021-01-29 NOTE — Progress Notes (Signed)
Patient will transfer out of the ICU today. FMTS will resume care tomorrow at 7am. On call resident was notified and confirmed the transfer.

## 2021-01-29 NOTE — Progress Notes (Signed)
Called respiratory to have pt placed on bipap for the night. Patient sats continue to stay below 92. Pt refused bipap.

## 2021-01-29 NOTE — TOC Transition Note (Addendum)
Transition of Care [facility]) - CM/SW Discharge Note   Patient Details  Name: Alex Martinez MRN: [id number] Date of Birth: Mar 18, 1966  [facility] CM/SW Contact:  Tom-Johnson, Renea Ee, RN Phone Number: 01/29/2021, 4:34 PM   Clinical Narrative:    CM spoke with patient at bedside about needs for post hospital transition. Presented with respiratory distress with hypoxia. Was on BIPAP but now on 5 oxygen. Coude cath placed by Urology. On diuretics and CIWA protocol. Patient states he lives alone. Was adopted but found biological parents in 2018. States he has one sister and no children. Does not get along with sister. Patient noted to be a little confused. Sates he has trust fund that was given to him by his dad that is deceased. States his MD, Dr. Redmond Pulling is in charge. Dr. Dois Davenport wife drives him around because they will jot allow him to drive. States he he hired a Chief Executive Officer named Delila Pereyra to get access to his funds. Patient will not answer question but goes back to talking abut his trust fund. States he is on disability and uses Geneticist, [facility] for meds. Does not have medical insurance. Information sent to financial counseling to verify information for first choice. Pt/OT recommended home health. Will follow up on that tomorrow. CM will continue to follow with needs.      Barriers to Discharge: Continued Medical Work up   Patient Goals and CMS Choice Patient states their goals for this hospitalization and ongoing recovery are:: To go home CMS Medicare.gov Compare Post Acute Care list provided to:: Patient    Discharge Placement                       Discharge Plan and Services   Discharge Planning Services: CM Consult                                 Social Determinants of Health (SDOH) Interventions     Readmission Risk Interventions No flowsheet data found.

## 2021-01-29 NOTE — Progress Notes (Signed)
NAME:  Alex Martinez, MRN:  [id number], DOB:  Aug 21, 1965, LOS: 1 ADMISSION DATE:  01/27/2021, CONSULTATION DATE:  01/28/2021 REFERRING MD:  Dolores Hoose, CHIEF COMPLAINT:  Hypercarbic resp failure   BRIEF HISTORY:  Alex Martinez is a 55 y.o. with a pertinent PMH of COPD, alcohol use disorder, type 2 diabetes, hypertension, pulmonary hypertension, who presented with altered mental status and admitted for hypercarbic respiratory failure.  Critical care called to bedside for sedation and hypercarbic respiratory failure with decreased level of consciousness.  Found to be obtunded and was transferred to the intensive care unit and to the pulmonary critical care service. Marland Kitchen   PAST MEDICAL HISTORY:   has a past medical history of Acute exacerbation of chronic obstructive pulmonary disease (COPD) ([place]) (01/10/2021), Alcohol abuse, Atherosclerosis of aorta ([place]) (01/10/2021), Congestive heart failure (CHF) ([place]), Diabetes mellitus without complication ([place]), Lobar pneumonia ([place]), Lymphadenopathy (04/11/2018), Renal disorder, S/P alcohol detoxification (12/05/2013), and Sepsis due to pneumonia ([place]) (04/17/2018). SIGNIFICANT HOSPITAL EVENTS:  10/26 - Admitted with hypercarbic resp failure on NIPPV   CONSULTS:  none PROCEDURES:  none SIGNIFICANT DIAGNOSTIC TESTS:   Echo  Result Date: 01/10/2021  1. Left ventricular ejection fraction, by estimation, is 60 to 65%. The  left ventricle has normal function. The left ventricle has no regional  wall motion abnormalities. Left ventricular diastolic parameters were  normal. There is the interventricular septum is flattened in systole, consistent with right ventricular pressure overload.   2. Right ventricular systolic function is normal. The right ventricular size is severely enlarged. There is moderately elevated pulmonary artery systolic pressure. The estimated right ventricular systolic pressure is 65.4 mmHg. The TR jet is very faint - suspect systolic PA  pressure is underestimated.   3. Left atrial size was mildly dilated.   4. Right atrial size was severely dilated.   5. The mitral valve is normal in structure. No evidence of mitral valve regurgitation. No evidence of mitral stenosis.   6. The aortic valve is normal in structure. There is mild calcification of the aortic valve. There is mild thickening of the aortic valve. Aortic valve regurgitation is not visualized. Mild to moderate aortic valve sclerosis/calcification is present, without any evidence of aortic stenosis.   7. The inferior vena cava is dilated in size with <50% respiratory  variability, suggesting right atrial pressure of 15 mmHg.  Chest XR: Result Date: 01/28/2021 The heart size and mediastinal contours are within normal limits. Both lungs are clear. The visualized skeletal structures are unremarkable.  VAS [place] LOWER EXTREMITY VENOUS (DVT) Result Date: 01/28/2021 RIGHT: - Portions of this examination were limited- see technologist comments above.  LEFT: - Portions of this examination were limited- see technologist comments above.  *See table(s) above for measurements and observations.    Preliminary     MICRO DATA:  10/26 MRSA PCR - negative 10/26 COVID - negative 10/26 Bcx - pending 10/25 Ucx - negative 10/26 Resp viral panel pending  ANTIMICROBIALS:  none   INTERIM HISTORY/SUBJECTIVE:  Overnight events: Did not tolerate BiPAP.  But has been saturating well on 3 L to 5 L nasal cannula.  Patient resting in bed and remains encephalopathic.  He is alert to himself but mumbles and endorses visual hallucinations. OBJECTIVE:  Blood pressure 116/69, pulse (!) 102, temperature 98.4 F (36.9 C), temperature source Oral, resp. rate 14, weight 113.2 kg, SpO2 92 %.    FiO2 (%):  [50 %] 50 %   Intake/Output Summary (Last 24 hours) at 01/29/2021 [phone]  Last data filed at 01/28/2021 [id number] Gross per 24 hour  Intake --  Output 210 ml  Net -210 ml   Filed Weights    01/28/21 1200 01/29/21 0500  Weight: 114 kg 113.2 kg    Examination: Physical Exam Constitutional:      Appearance: He is ill-appearing (chronically).  HENT:     Head: Normocephalic and atraumatic.  Eyes:     Extraocular Movements: Extraocular movements intact.     Pupils: Pupils are equal, round, and reactive to light.  Cardiovascular:     Rate and Rhythm: Regular rhythm. Tachycardia present.     Pulses: Normal pulses.     Heart sounds: No murmur heard. Pulmonary:     Effort: No respiratory distress.     Breath sounds: Wheezing (expiratory wheezing ni upper lung feilds) present.  Abdominal:     General: There is no distension.     Palpations: Abdomen is soft.     Tenderness: There is no abdominal tenderness.  Musculoskeletal:        General: Swelling (anasarca) present. Normal range of motion.  Skin:    General: Skin is warm and dry.  Neurological:     Mental Status: He is alert. He is disoriented.     Comments: Tremulous  Psychiatric:        Attention and Perception: He is inattentive. He perceives visual hallucinations.   Patient Lines/Drains/Airways Status     Active Line/Drains/Airways     Name Placement date Placement time Site Days   Peripheral IV 01/28/21 20 G Right Hand 01/28/21  0014  Hand  1           RESOLVED HOSPITAL PROBLEMS:   none ASSESSMENT & PLAN:  Acute on chronic hypercarbic respiratory failure in the setting of COPD (significant tobacco use disorder), and possibly a component of CHF No previous PFT's.  Does have evidence of COPD and requiring 3 to 5 L supplemental oxygen. Has a long hx of tobacco use disorder, been smoking 1-2 packs per day since 55 yo -We will give prednisone 40 mg for 3 days -Titrate supplemental oxygen to achieve SPO2 of 90 -BiPAP nightly as needed -BG as needed if he desats or has worsening mentation.   Encephalopathy, multifactorial Patient presents with encephalopathy in the setting of significant hypercarbia and  underlying alcohol use disorder with history of withdrawal.  Patient does appear like he is having signs and symptoms of withdrawal and his oxygenation has improved. -Continue CIWA protocol -Hold off on long-acting benzodiazepines or phenobarbital, cautiously give Ativan as needed for withdrawal -U tox pending - Aspiration precautions - Swallow eval  COPD: - Continue Duonebs - Start prednisone 40 mg daily for 3 days.    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 Received IV lasix 120 mg last night with 1.2 L out. No recent CT scans or Rt heart Caths. Recent Echo shows elevated pulm pressure in the mid 50's. - Continue Diuresis wit lasix 60 IV Bid - Star Diamox 500 mg BID for 2 days   Polysubstance abuse CIWA protocol Thiamine folic acid Multivitamins Close observation intensive care unit    Scrotal edema Ultrasound examination of scrotum  BEST PRACTICE:  Diet: NPO until more awake Pain/Anxiety/Delirium protocol (if indicated): no,  VAP protocol (if indicated): no DVT prophylaxis: SCD GI prophylaxis: N/A Glucose control: SSI No Lines: N/A Foley:  N/A Mobility: bed rest, Code Status: full code Family Communication: pending today Disposition:  ICU  Labs   CBC: Recent Labs  Lab 01/27/21 1701 01/28/21 0554 01/28/21 0707 01/29/21 0318  WBC 7.1  --   --  8.0  NEUTROABS 5.1  --   --   --   HGB 15.1 17.3* 16.7 13.8  HCT 49.1 51.0 49.0 46.5  MCV 97.4  --   --  100.2*  PLT 294  --   --  568    Basic Metabolic Panel: Recent Labs  Lab 01/27/21 1810 01/28/21 0443 01/28/21 0554 01/28/21 0707 01/29/21 0318  NA 132* 130* 130* 133* 136  K 4.1 4.6 4.8 3.8 4.5  CL 83* 84*  --   --  86*  CO2 38* 40*  --   --  >45*  GLUCOSE 99 125*  --   --  91  BUN 16 13  --   --  10  CREATININE 0.78 0.69  --   --  0.65  CALCIUM 8.8* 8.4*  --   --  9.1  MG  --   --   --   --  2.0   GFR: Estimated Creatinine Clearance: 131.5  mL/min (by C-G formula based on SCr of 0.65 mg/dL). Recent Labs  Lab 01/27/21 1701 01/28/21 1253 01/28/21 1500 01/29/21 0318  PROCALCITON  --  <0.10  --   --   WBC 7.1  --   --  8.0  LATICACIDVEN  --  1.6 0.9  --     Liver Function Tests: Recent Labs  Lab 01/27/21 1810 01/28/21 0443  AST 26 44*  ALT 15 10  ALKPHOS 97 105  BILITOT 1.4* 1.8*  PROT 6.8 6.0*  ALBUMIN 3.2* 3.0*   No results for input(s): LIPASE, AMYLASE in the last 168 hours. No results for input(s): AMMONIA in the last 168 hours.  ABG    Component Value Date/Time   PHART 7.239 (L) 01/28/2021 0956   PCO2ART 107 (HH) 01/28/2021 0956   PO2ART 79.1 (L) 01/28/2021 0956   HCO3 44.0 (H) 01/28/2021 0956   TCO2 >50 (H) 01/28/2021 0707   O2SAT 93.4 01/28/2021 0956     Coagulation Profile: No results for input(s): INR, PROTIME in the last 168 hours.  Cardiac Enzymes: No results for input(s): CKTOTAL, CKMB, CKMBINDEX, TROPONINI in the last 168 hours.  HbA1C: Hgb A1c MFr Bld  Date/Time Value Ref Range Status  01/10/2021 03:35 AM 6.7 (H) 4.8 - 5.6 % Final    Comment:    (NOTE) Pre diabetes:          5.7%-6.4%  Diabetes:              >6.4%  Glycemic control for   <7.0% adults with diabetes   04/11/2018 05:02 AM 7.7 (H) 4.8 - 5.6 % Final    Comment:    (NOTE) Pre diabetes:          5.7%-6.4% Diabetes:              >6.4% Glycemic control for   <7.0% adults with diabetes     CBG: Recent Labs  Lab 01/28/21 1202 01/28/21 1539 01/28/21 1931 01/28/21 2332 01/29/21 0337  GLUCAP 205* 165* 131* 104* 90    Review of Systems:   See above  Past Medical History  He,  has a past medical history of Acute exacerbation of chronic obstructive pulmonary disease (COPD) ([place]) (01/10/2021), Alcohol abuse, Atherosclerosis of aorta ([place]) (01/10/2021), Congestive heart failure (CHF) ([place]), Diabetes mellitus without complication ([place]), Lobar pneumonia ([place]), Lymphadenopathy (04/11/2018), Renal disorder,  S/P alcohol  detoxification (12/05/2013), and Sepsis due to pneumonia ([place]) (04/17/2018).   Surgical History    Past Surgical History:  Procedure Laterality Date   arm surgery     KNEE SURGERY       Social History   reports that he has been smoking cigarettes. He has a 74.00 pack-year smoking history. He has never used smokeless tobacco. He reports current alcohol use of about 56.0 standard drinks per week. He reports that he does not use drugs.   Family History   His family history includes Cancer in his father. He was adopted.   Allergies Allergies  Allergen Reactions   Museum/gallery curator and Other (See Comments)    Shaking, starts sweating really bad   Yellow Jacket Venom [Bee Venom] Swelling     Home Medications  Prior to Admission medications   Medication Sig Start Date End Date Taking? Authorizing Provider  albuterol (PROVENTIL HFA;VENTOLIN HFA) 108 (90 Base) MCG/ACT inhaler Inhale 2 puffs into the lungs every 6 (six) hours as needed for wheezing or shortness of breath. 04/17/18  Yes Dhungel, Flonnie Overman, MD  blood glucose meter kit and supplies Dispense based on patient and insurance preference. Use up to four times daily as directed. (FOR ICD-10 E10.9, E11.9). 04/17/18   Dhungel, Flonnie Overman, MD  furosemide (LASIX) 80 MG tablet Take 160 mg by mouth daily.    [provider]  metFORMIN (GLUCOPHAGE) 500 MG tablet Take 1 tablet (500 mg total) by mouth 2 (two) times daily with a meal. 04/17/18 04/17/19  Dhungel, Flonnie Overman, MD  metoprolol tartrate (LOPRESSOR) 50 MG tablet Take 1 tablet (50 mg total) by mouth 2 (two) times daily. Patient not taking: Reported on 01/09/2021 04/17/18   Louellen Molder, MD     Critical care time: [place], D.O.  Internal Medicine Resident, PGY-3 [facility]  Pager: [phone] 6:24 AM, 01/29/2021

## 2021-01-29 NOTE — Progress Notes (Signed)
Bladder scan at 2300 10/26 revealed 450m. Multiple attempts at inserting foley catheter with met resistance. Two attempts made by two nurses. E-Link notified and foley team called to attempt insertion without success. E-Link notified and MD aware. Urology consulted.

## 2021-01-29 NOTE — Consult Note (Signed)
Urology Consult   Physician requesting consult: Dr. Jairo Ben  Reason for consult: Urinary retention and difficult urethral catheterization  History of Present Illness: Alex Martinez is a 55 y.o. with a history of alcohol abuse, diabetes and right sided heart failure who was admitted with acute respiratory failure and lower extremity edema.  He was noted to be in urinary retention overnight with > 600 cc in his bladder on bladder scan.  Multiple attempts to place a urethral catheter were unsuccessful.  He states that he has had gradual, progressive lower urinary tract symptoms.  He has never been on medical therapy for BPH.  No family history of prostate cancer.  No prior GU surgery or trauma.  Past Medical History:  Diagnosis Date   Acute exacerbation of chronic obstructive pulmonary disease (COPD) ([place]) 01/10/2021   Alcohol abuse    Atherosclerosis of aorta ([place]) 01/10/2021   Congestive heart failure (CHF) ([facility])    Diabetes mellitus without complication ([place])    Lobar pneumonia ([place])    Lymphadenopathy 04/11/2018   Renal disorder    S/P alcohol detoxification 12/05/2013   Sepsis due to pneumonia ([place]) 04/17/2018    Past Surgical History:  Procedure Laterality Date   arm surgery     KNEE SURGERY      Medications:  Home meds:  No current facility-administered medications on file prior to encounter.   Current Outpatient Medications on File Prior to Encounter  Medication Sig Dispense Refill   albuterol (PROVENTIL HFA;VENTOLIN HFA) 108 (90 Base) MCG/ACT inhaler Inhale 2 puffs into the lungs every 6 (six) hours as needed for wheezing or shortness of breath. 1 Inhaler 2   blood glucose meter kit and supplies Dispense based on patient and insurance preference. Use up to four times daily as directed. (FOR ICD-10 E10.9, E11.9). 1 each 0   furosemide (LASIX) 80 MG tablet Take 160 mg by mouth daily.     metFORMIN (GLUCOPHAGE) 500 MG tablet Take 1 tablet (500 mg total) by mouth 2 (two)  times daily with a meal. 60 tablet 0   metoprolol tartrate (LOPRESSOR) 50 MG tablet Take 1 tablet (50 mg total) by mouth 2 (two) times daily. (Patient not taking: Reported on 01/09/2021) 60 tablet 0     Scheduled Meds:  Chlorhexidine Gluconate Cloth  6 each Topical Daily   enoxaparin (LOVENOX) injection  40 mg Subcutaneous Daily   folic acid  1 mg Oral Daily   insulin aspart  0-6 Units Subcutaneous Q4H   ipratropium-albuterol  3 mL Nebulization Q4H   multivitamin with minerals  1 tablet Oral Daily   thiamine  100 mg Oral Daily   Or   thiamine  100 mg Intravenous Daily   Continuous Infusions: PRN Meds:.polyethylene glycol  Allergies:  Allergies  Allergen Reactions   Museum/gallery curator and Other (See Comments)    Shaking, starts sweating really bad   Yellow Jacket Venom [Bee Venom] Swelling    Family History  Adopted: Yes  Problem Relation Age of Onset   Cancer Father     Social History:  reports that he has been smoking cigarettes. He has a 74.00 pack-year smoking history. He has never used smokeless tobacco. He reports current alcohol use of about 56.0 standard drinks per week. He reports that he does not use drugs.  ROS: A complete review of systems was performed.  All systems are negative except for pertinent findings as noted.  Physical Exam:  Vital signs in last 24 hours: Temp:  [  97.5 F (36.4 C)-98.7 F (37.1 C)] 98.4 F (36.9 C) (10/27 0355) Pulse Rate:  [89-114] 101 (10/27 0400) Resp:  [13-28] 24 (10/27 0400) BP: (90-137)/(55-97) 118/76 (10/27 0330) SpO2:  [88 %-99 %] 91 % (10/27 0400) FiO2 (%):  [50 %] 50 % (10/26 1600) Weight:  [426 kg] 114 kg (10/26 1200) Constitutional:  Alert and oriented, No acute distress Cardiovascular: No JVD, lower extremity edema with erythema of thighs Respiratory: Normal respiratory effort GI: Abdomen is obese soft, nontender, nondistended. Genitourinary: Normal male phallus, testes are descended bilaterally and non-tender and  without masses, scrotum is normal in appearance without lesions or masses, perineum is normal on inspection.  Urethral meatus appears normal. Lymphatic: No lymphadenopathy Neurologic: Grossly intact, no focal deficits Psychiatric: Normal mood and affect  Laboratory Data:  Recent Labs    01/27/21 1701 01/28/21 0554 01/28/21 0707 01/29/21 0318  WBC 7.1  --   --  8.0  HGB 15.1 17.3* 16.7 13.8  HCT 49.1 51.0 49.0 46.5  PLT 294  --   --  263    Recent Labs    01/27/21 1810 01/28/21 0443 01/28/21 0554 01/28/21 0707 01/29/21 0318  NA 132* 130* 130* 133* 136  K 4.1 4.6 4.8 3.8 4.5  CL 83* 84*  --   --  86*  GLUCOSE 99 125*  --   --  91  BUN 16 13  --   --  10  CALCIUM 8.8* 8.4*  --   --  9.1  CREATININE 0.78 0.69  --   --  0.65     Results for orders placed or performed during the hospital encounter of 01/27/21 (from the past 24 hour(s))  I-Stat beta hCG blood, ED ([facility], [facility], [facility] only)     Status: None   Collection Time: 01/28/21  5:19 AM  Result Value Ref Range   I-stat hCG, quantitative <5.0 <5 mIU/mL   Comment 3          I-Stat venous blood gas, [facility] ED)     Status: Abnormal   Collection Time: 01/28/21  5:54 AM  Result Value Ref Range   pH, Ven 7.486 (H) 7.250 - 7.430   pCO2, Ven 61.4 (H) 44.0 - 60.0 mmHg   pO2, Ven 171.0 (H) 32.0 - 45.0 mmHg   Bicarbonate 46.3 (H) 20.0 - 28.0 mmol/L   TCO2 48 (H) 22 - 32 mmol/L   O2 Saturation 100.0 %   Acid-Base Excess 18.0 (H) 0.0 - 2.0 mmol/L   Sodium 130 (L) 135 - 145 mmol/L   Potassium 4.8 3.5 - 5.1 mmol/L   Calcium, Ion 0.91 (L) 1.15 - 1.40 mmol/L   HCT 51.0 39.0 - 52.0 %   Hemoglobin 17.3 (H) 13.0 - 17.0 g/dL   Sample type VENOUS   TSH     Status: None   Collection Time: 01/28/21  6:48 AM  Result Value Ref Range   TSH 2.451 0.350 - 4.500 uIU/mL  I-Stat arterial blood gas, ED     Status: Abnormal   Collection Time: 01/28/21  7:07 AM  Result Value Ref Range   pH, Arterial 7.263 (L) 7.350 - 7.450   pCO2 arterial 106.9 (HH)  32.0 - 48.0 mmHg   pO2, Arterial 148 (H) 83.0 - 108.0 mmHg   Bicarbonate 48.6 (H) 20.0 - 28.0 mmol/L   TCO2 >50 (H) 22 - 32 mmol/L   O2 Saturation 99.0 %   Acid-Base Excess 15.0 (H) 0.0 - 2.0 mmol/L   Sodium 133 (  L) 135 - 145 mmol/L   Potassium 3.8 3.5 - 5.1 mmol/L   Calcium, Ion 1.16 1.15 - 1.40 mmol/L   HCT 49.0 39.0 - 52.0 %   Hemoglobin 16.7 13.0 - 17.0 g/dL   Patient temperature 97.6 F    Collection site RADIAL, ALLEN'S TEST ACCEPTABLE    Drawn by RT    Sample type ARTERIAL    Comment NOTIFIED PHYSICIAN   CBG monitoring, ED     Status: Abnormal   Collection Time: 01/28/21  7:59 AM  Result Value Ref Range   Glucose-Capillary 229 (H) 70 - 99 mg/dL  Blood gas, arterial     Status: Abnormal   Collection Time: 01/28/21  9:56 AM  Result Value Ref Range   FIO2 50.00    pH, Arterial 7.239 (L) 7.350 - 7.450   pCO2 arterial 107 (HH) 32.0 - 48.0 mmHg   pO2, Arterial 79.1 (L) 83.0 - 108.0 mmHg   Bicarbonate 44.0 (H) 20.0 - 28.0 mmol/L   Acid-Base Excess 16.2 (H) 0.0 - 2.0 mmol/L   O2 Saturation 93.4 %   Patient temperature 37.0    Collection site RIGHT RADIAL    Drawn by COLLECTED BY RT    Sample type ARTERIAL DRAW    Allens test (pass/fail) PASS PASS  Glucose, capillary     Status: Abnormal   Collection Time: 01/28/21 12:02 PM  Result Value Ref Range   Glucose-Capillary 205 (H) 70 - 99 mg/dL  Lipid panel     Status: Abnormal   Collection Time: 01/28/21 12:53 PM  Result Value Ref Range   Cholesterol 125 0 - 200 mg/dL   Triglycerides 48 <150 mg/dL   HDL 37 (L) >40 mg/dL   Total CHOL/HDL Ratio 3.4 RATIO   VLDL 10 0 - 40 mg/dL   LDL Cholesterol 78 0 - 99 mg/dL  Ethanol     Status: None   Collection Time: 01/28/21 12:53 PM  Result Value Ref Range   Alcohol, Ethyl (B) <47 <82 mg/dL  Salicylate level     Status: Abnormal   Collection Time: 01/28/21 12:53 PM  Result Value Ref Range   Salicylate Lvl <9.5 (L) 7.0 - 30.0 mg/dL  Procalcitonin - Baseline     Status: None    Collection Time: 01/28/21 12:53 PM  Result Value Ref Range   Procalcitonin <0.10 ng/mL  Lactic acid, plasma     Status: None   Collection Time: 01/28/21 12:53 PM  Result Value Ref Range   Lactic Acid, Venous 1.6 0.5 - 1.9 mmol/L  MRSA Next Gen by PCR, Nasal     Status: None   Collection Time: 01/28/21  1:59 PM   Specimen: Nasal Mucosa; Nasal Swab  Result Value Ref Range   MRSA by PCR Next Gen NOT DETECTED NOT DETECTED  Lactic acid, plasma     Status: None   Collection Time: 01/28/21  3:00 PM  Result Value Ref Range   Lactic Acid, Venous 0.9 0.5 - 1.9 mmol/L  Glucose, capillary     Status: Abnormal   Collection Time: 01/28/21  3:39 PM  Result Value Ref Range   Glucose-Capillary 165 (H) 70 - 99 mg/dL  Glucose, capillary     Status: Abnormal   Collection Time: 01/28/21  7:31 PM  Result Value Ref Range   Glucose-Capillary 131 (H) 70 - 99 mg/dL  Glucose, capillary     Status: Abnormal   Collection Time: 01/28/21 11:32 PM  Result Value Ref Range   Glucose-Capillary 104 (  H) 70 - 99 mg/dL  CBC     Status: Abnormal   Collection Time: 01/29/21  3:18 AM  Result Value Ref Range   WBC 8.0 4.0 - 10.5 K/uL   RBC 4.64 4.22 - 5.81 MIL/uL   Hemoglobin 13.8 13.0 - 17.0 g/dL   HCT 46.5 39.0 - 52.0 %   MCV 100.2 (H) 80.0 - 100.0 fL   MCH 29.7 26.0 - 34.0 pg   MCHC 29.7 (L) 30.0 - 36.0 g/dL   RDW 15.5 11.5 - 15.5 %   Platelets 263 150 - 400 K/uL   nRBC 0.0 0.0 - 0.2 %  Basic metabolic panel     Status: Abnormal (Preliminary result)   Collection Time: 01/29/21  3:18 AM  Result Value Ref Range   Sodium 136 135 - 145 mmol/L   Potassium 4.5 3.5 - 5.1 mmol/L   Chloride 86 (L) 98 - 111 mmol/L   CO2 >45 (H) 22 - 32 mmol/L   Glucose, Bld 91 70 - 99 mg/dL   BUN 10 6 - 20 mg/dL   Creatinine, Ser 0.65 0.61 - 1.24 mg/dL   Calcium 9.1 8.9 - 10.3 mg/dL   GFR, Estimated >60 >60 mL/min   Anion gap PENDING 5 - 15  Magnesium     Status: None   Collection Time: 01/29/21  3:18 AM  Result Value Ref  Range   Magnesium 2.0 1.7 - 2.4 mg/dL  Glucose, capillary     Status: None   Collection Time: 01/29/21  3:37 AM  Result Value Ref Range   Glucose-Capillary 90 70 - 99 mg/dL   Recent Results (from the past 240 hour(s))  Urine Culture     Status: None   Collection Time: 01/27/21  4:43 PM   Specimen: Urine, Clean Catch  Result Value Ref Range Status   Specimen Description URINE, CLEAN CATCH  Final   Special Requests NONE  Final   Culture   Final    NO GROWTH Performed at [facility], [address] [place], [place] [address]    Report Status 01/28/2021 FINAL  Final  Resp Panel by RT-PCR (Flu A&B, Covid) Nasopharyngeal Swab     Status: None   Collection Time: 01/27/21 11:09 PM   Specimen: Nasopharyngeal Swab; Nasopharyngeal(NP) swabs in vial transport medium  Result Value Ref Range Status   SARS Coronavirus 2 by RT PCR NEGATIVE NEGATIVE Final    Comment: (NOTE) SARS-CoV-2 target nucleic acids are NOT DETECTED.  The SARS-CoV-2 RNA is generally detectable in upper respiratory specimens during the acute phase of infection. The lowest concentration of SARS-CoV-2 viral copies this assay can detect is 138 copies/mL. A negative result does not preclude SARS-Cov-2 infection and should not be used as the sole basis for treatment or other patient management decisions. A negative result may occur with  improper specimen collection/handling, submission of specimen other than nasopharyngeal swab, presence of viral mutation(s) within the areas targeted by this assay, and inadequate number of viral copies(<138 copies/mL). A negative result must be combined with clinical observations, patient history, and epidemiological information. The expected result is Negative.  Fact Sheet for Patients:  EntrepreneurPulse.com.au  Fact Sheet for Healthcare Providers:  IncredibleEmployment.be  This test is no t yet approved or cleared by the [place] FDA  and  has been authorized for detection and/or diagnosis of SARS-CoV-2 by FDA under an Emergency Use Authorization (EUA). This EUA will remain  in effect (meaning this test can be used) for the  duration of the COVID-19 declaration under Section 564(b)(1) of the Act, 21 U.S.C.section 360bbb-3(b)(1), unless the authorization is terminated  or revoked sooner.       Influenza A by PCR NEGATIVE NEGATIVE Final   Influenza B by PCR NEGATIVE NEGATIVE Final    Comment: (NOTE) The Xpert Xpress SARS-CoV-2/FLU/RSV plus assay is intended as an aid in the diagnosis of influenza from Nasopharyngeal swab specimens and should not be used as a sole basis for treatment. Nasal washings and aspirates are unacceptable for Xpert Xpress SARS-CoV-2/FLU/RSV testing.  Fact Sheet for Patients: EntrepreneurPulse.com.au  Fact Sheet for Healthcare Providers: IncredibleEmployment.be  This test is not yet approved or cleared by the [place] FDA and has been authorized for detection and/or diagnosis of SARS-CoV-2 by FDA under an Emergency Use Authorization (EUA). This EUA will remain in effect (meaning this test can be used) for the duration of the COVID-19 declaration under Section 564(b)(1) of the Act, 21 U.S.C. section 360bbb-3(b)(1), unless the authorization is terminated or revoked.  Performed at [facility], [place] [address] [place], [place] [address]   MRSA Next Gen by PCR, Nasal     Status: None   Collection Time: 01/28/21  1:59 PM   Specimen: Nasal Mucosa; Nasal Swab  Result Value Ref Range Status   MRSA by PCR Next Gen NOT DETECTED NOT DETECTED Final    Comment: (NOTE) The GeneXpert MRSA Assay (FDA approved for NASAL specimens only), is one component of a comprehensive MRSA colonization surveillance program. It is not intended to diagnose MRSA infection nor to guide or monitor treatment for MRSA infections. Test performance is not FDA approved in  patients less than 47 years old. Performed at [facility], [place] [address], [place], [place] [address]     Renal Function: Recent Labs    01/27/21 1810 01/28/21 0443 01/29/21 0318  CREATININE 0.78 0.69 0.65   Estimated Creatinine Clearance: 131.9 mL/min (by C-G formula based on SCr of 0.65 mg/dL).  Radiologic Imaging: DG Chest 2 View  Result Date: 01/27/2021 CLINICAL DATA:  Shortness of breath. EXAM: CHEST - 2 VIEW COMPARISON:  January 09, 2021 FINDINGS: The heart size and mediastinal contours are within normal limits. Both lungs are clear. The visualized skeletal structures are unremarkable. IMPRESSION: No active cardiopulmonary disease. Electronically Signed   By: Virgina Norfolk M.D.   On: 01/27/2021 22:25   VAS [place] LOWER EXTREMITY VENOUS (DVT)  Result Date: 01/28/2021  Lower Venous DVT Study Patient Name:  TRAYCEN GOYER  Date of Exam:   01/28/2021 Medical Rec #: [id number]           Accession #:    [id number] Date of Birth: [birth year]            Patient Gender: M Patient Age:   43 years Exam Location:  [facility] Procedure:      VAS [place] LOWER EXTREMITY VENOUS (DVT) Referring Phys: Ina Homes --------------------------------------------------------------------------------  Indications: Edema.  Limitations: Body habitus, poor ultrasound/tissue interface and patient inability to cooperate. Comparison Study: No previous exams Performing Technologist: Jody Hill RVT, RDMS  Examination Guidelines: A complete evaluation includes B-mode imaging, spectral Doppler, color Doppler, and power Doppler as needed of all accessible portions of each vessel. Bilateral testing is considered an integral part of a complete examination. Limited examinations for reoccurring indications may be performed as noted. The reflux portion of the exam is performed with the patient in reverse Trendelenburg.  +---------+---------------+---------+-----------+----------+------------------+ RIGHT     CompressibilityPhasicitySpontaneityPropertiesThrombus Aging     +---------+---------------+---------+-----------+----------+------------------+  CFV      Full           Yes      Yes                                     +---------+---------------+---------+-----------+----------+------------------+ SFJ      Full                                                            +---------+---------------+---------+-----------+----------+------------------+ FV Prox  Full           Yes      Yes                                     +---------+---------------+---------+-----------+----------+------------------+ FV Mid   Full           Yes      Yes                                     +---------+---------------+---------+-----------+----------+------------------+ FV DistalFull           Yes      Yes                                     +---------+---------------+---------+-----------+----------+------------------+ PFV      Full                                                            +---------+---------------+---------+-----------+----------+------------------+ POP      Full           Yes      Yes                                     +---------+---------------+---------+-----------+----------+------------------+ PTV                                                   Not seen on this                                                         exam               +---------+---------------+---------+-----------+----------+------------------+ PERO  Not well                                                                 visualized         +---------+---------------+---------+-----------+----------+------------------+   Right Technical Findings: Not visualized segments include PTVs.  +---------+---------------+---------+-----------+----------+------------------+ LEFT      CompressibilityPhasicitySpontaneityPropertiesThrombus Aging     +---------+---------------+---------+-----------+----------+------------------+ CFV      Full           Yes      Yes                                     +---------+---------------+---------+-----------+----------+------------------+ SFJ      Full                                                            +---------+---------------+---------+-----------+----------+------------------+ FV Prox  Full           Yes      Yes                                     +---------+---------------+---------+-----------+----------+------------------+ FV Mid   Full           Yes      Yes                                     +---------+---------------+---------+-----------+----------+------------------+ FV DistalFull           Yes      Yes                                     +---------+---------------+---------+-----------+----------+------------------+ PFV      Full                                                            +---------+---------------+---------+-----------+----------+------------------+ POP      Full           Yes      Yes                                     +---------+---------------+---------+-----------+----------+------------------+ PTV                                                   Not seen on this  exam               +---------+---------------+---------+-----------+----------+------------------+ PERO                                                  Not well                                                                 visualized         +---------+---------------+---------+-----------+----------+------------------+   Left Technical Findings: Not visualized segments include PTVs.   Summary: BILATERAL: - No evidence of deep vein thrombosis seen in the lower extremities, bilaterally. - No evidence of superficial  venous thrombosis in the lower extremities, bilaterally. -No evidence of popliteal cyst, bilaterally. RIGHT: - Portions of this examination were limited- see technologist comments above.  LEFT: - Portions of this examination were limited- see technologist comments above.  *See table(s) above for measurements and observations.    Preliminary     I independently reviewed the above imaging studies.  Procedure: Under sterile conditions, I attempted to place an 18 Fr coude catheter with resistance met proximally.  I then performed flexible cystoscopy.  There was what appeared to be a small false passage in the bulbar urethra.  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  I inserted a 0.38 Sensor guide wire.  I was then able to insert an 18 Council tip catheter over the wire into the bladder with < 500 cc of grossly clear urine immediately obtained.  Impression/Recommendation Urinary retention with difficult urethral catheterization: Likely due to BPH.  When/if appropriate, I would begin alpha blocker therapy with tamsulosin, etc prior to removal of catheter.  Once no longer medically necessary and after at least 48 hrs of alpha blocker therapy, ok to proceed with voiding trial with monitoring of PVRs.. If PVR is < 300 cc consistently, will be ok to discharge on tamsulosin with plans for outpatient follow up as an outpatient.  If PVRs > 300 cc, please call for further recommendations.  Dutch Gray 01/29/2021, 4:44 AM    Pryor Curia MD  CC: Dr. Jeannene Patella

## 2021-01-29 NOTE — Progress Notes (Addendum)
eLink Physician-Brief Progress Note Patient Name: Alex Martinez DOB: 1965/09/07 MRN: [id number]   Date of Service  01/29/2021  HPI/Events of Note  Bladder scan with over 600 cc urine and RN has tried straight cath earlier as well as Coude attempts with foley team which failed  eICU Interventions  Urology consult placed. Will page them.      Intervention Category Intermediate Interventions: Oliguria - evaluation and management  Alex Martinez G [name] 01/29/2021, 3:01 AM  Addendum at 3:10  Dr Alinda Money promptly returned page Will come and place catheter and requested that bedside call OR to have the urology cart sent down. Appreciate prompt assistance

## 2021-01-29 NOTE — Evaluation (Signed)
Occupational Therapy Evaluation Patient Details Name: Alex Martinez MRN: [id number] DOB: 10-Jun-1965 Today's Date: 01/29/2021   History of Present Illness 55 Y/O M with PMX of DM2, HTN, diastolic CHF, ETOH & Tobacco abuse, COPD, Pulm HTN, presents with worsening SOB, leg edema, and scrotal edema. He was seen for a similar presentation a few days ago and left without completing treatment.   Clinical Impression   Patient admitted for the diagnosis above.  PTA he lives in a one level rental home.  He relies on friends/landlord for community mobility.  He states he stays in the home and smokes all day.  He is confused, but states he is able to care for his own ADL and IADL at home.  Deficits impacting independence are listed below.  Currently he is needing up to [place] for basic mobility, safety cues, and up to [place] for lower body ADL.  [facility] OT can be considered depending on progress.  OT will follow in the acute setting.        Recommendations for follow up therapy are one component of a multi-disciplinary discharge planning process, led by the attending physician.  Recommendations may be updated based on patient status, additional functional criteria and insurance authorization.   Follow Up Recommendations  Home health OT    Assistance Recommended at Discharge Intermittent Supervision/Assistance  Functional Status Assessment  Patient has had a recent decline in their functional status and demonstrates the ability to make significant improvements in function in a reasonable and predictable amount of time.  Equipment Recommendations  [facility]    Recommendations for Other Services       Precautions / Restrictions Precautions Precautions: Fall Precaution Comments: watch O2 Restrictions Weight Bearing Restrictions: No      Mobility Bed Mobility Overal bed mobility: Needs Assistance Bed Mobility: Supine to Sit     Supine to sit: Min assist       Patient Response:  Cooperative  Transfers Overall transfer level: Needs assistance   Transfers: Sit to/from Stand;Stand Pivot Transfers Sit to Stand: Min assist Stand pivot transfers: Min assist                Balance Overall balance assessment: Needs assistance Sitting-balance support: Feet supported;Single extremity supported Sitting balance-Leahy Scale: Fair     Standing balance support: Bilateral upper extremity supported Standing balance-Leahy Scale: Poor Standing balance comment: needing safety cues and RW                           ADL either performed or assessed with clinical judgement   ADL       Grooming: Wash/dry hands;Set up;Sitting       Lower Body Bathing: Moderate assistance;Sit to/from stand   Upper Body Dressing : Supervision/safety;Sitting   Lower Body Dressing: Moderate assistance;Sit to/from stand   Toilet Transfer: Minimal assistance;Rolling walker (2 wheels)   Toileting- Clothing Manipulation and Hygiene: Moderate assistance;Sit to/from stand Toileting - Clothing Manipulation Details (indicate cue type and reason): LE edema impacts             Vision Patient Visual Report: No change from baseline       Perception Perception Perception: Not tested   Praxis Praxis Praxis: Not tested    Pertinent Vitals/Pain Pain Assessment: No/denies pain Body Movements: Restlessness     Hand Dominance Right   Extremity/Trunk Assessment Upper Extremity Assessment Upper Extremity Assessment: Generalized weakness   Lower Extremity Assessment Lower Extremity Assessment: Defer  to PT evaluation   Cervical / Trunk Assessment Cervical / Trunk Assessment: Normal   Communication Communication Communication: No difficulties   Cognition Arousal/Alertness: Awake/alert Behavior During Therapy: Restless Overall Cognitive Status: Impaired/Different from baseline Area of Impairment: Memory;Following commands;Safety/judgement;Problem solving                      Memory: Decreased short-term memory Following Commands: Follows one step commands with increased time Safety/Judgement: Decreased awareness of safety;Decreased awareness of deficits   Problem Solving: Slow processing;Requires verbal cues;Requires tactile cues       General Comments       Exercises     Shoulder Instructions      Home Living Family/patient expects to be discharged to:: Other (Comment) (Rental home) Living Arrangements: Alone Available Help at Discharge: Friend(s);Available PRN/intermittently Type of Home: House Home Access: Level entry     Home Layout: One level     Bathroom Shower/Tub: Teacher, early years/pre: Handicapped height     Home Equipment: None          Prior Functioning/Environment Prior Level of Function : Independent/Modified Independent             Mobility Comments: Patient did walk household distances without an AD.  Dose not drive, and relies on friends for transportation. ADLs Comments: Patient states was was independent for ADL and IADL completion.        OT Problem List: Decreased strength;Decreased activity tolerance;Impaired balance (sitting and/or standing);Decreased safety awareness;Decreased cognition;Increased edema;Decreased knowledge of use of DME or AE;Decreased knowledge of precautions      OT Treatment/Interventions: Self-care/ADL training;Therapeutic exercise;Therapeutic activities;Patient/family education;Balance training;DME and/or AE instruction    OT Goals(Current goals can be found in the care plan section) Acute Rehab OT Goals Patient Stated Goal: I need to find a new place to live OT Goal Formulation: With patient Time For Goal Achievement: 02/12/21 Potential to Achieve Goals: Good ADL Goals Pt Will Perform Grooming: with set-up;standing Pt Will Perform Lower Body Bathing: with set-up;sit to/from stand Pt Will Perform Lower Body Dressing: with set-up;sit to/from stand Pt  Will Transfer to Toilet: with set-up;ambulating;regular height toilet Pt Will Perform Toileting - Clothing Manipulation and hygiene: with set-up;sit to/from stand Pt/caregiver will Perform Home Exercise Program: Increased strength;Both right and left upper extremity;With theraband;With written HEP provided  OT Frequency: Min 2X/week   Barriers to D/C: Decreased caregiver support          Co-evaluation PT/OT/SLP Co-Evaluation/Treatment: Yes Reason for Co-Treatment: Necessary to address cognition/behavior during functional activity;For patient/therapist safety;To address functional/ADL transfers;Complexity of the patient's impairments (multi-system involvement)   OT goals addressed during session: ADL's and self-care      AM-PAC OT "6 Clicks" Daily Activity     Outcome Measure Help from another person eating meals?: None Help from another person taking care of personal grooming?: A Little Help from another person toileting, which includes using toliet, bedpan, or urinal?: A Little Help from another person bathing (including washing, rinsing, drying)?: A Lot Help from another person to put on and taking off regular upper body clothing?: None Help from another person to put on and taking off regular lower body clothing?: A Lot 6 Click Score: 18   End of Session Equipment Utilized During Treatment: Rolling walker (2 wheels);Oxygen Nurse Communication: Mobility status  Activity Tolerance: Patient limited by fatigue Patient left: in chair;with call bell/phone within reach;with chair alarm set  OT Visit Diagnosis: Unsteadiness on feet (R26.81);Muscle weakness (generalized) (M62.81);Other symptoms  and signs involving cognitive function                Time: 1442-1500 OT Time Calculation (min): 18 min Charges:  OT General Charges $OT Visit: 1 Visit OT Evaluation $OT Eval Moderate Complexity: 1 Mod  01/29/2021  RP, OTR/L  Acute Rehabilitation Services  Office:   [phone]   Metta Clines 01/29/2021, 3:20 PM

## 2021-01-29 NOTE — Evaluation (Signed)
Physical Therapy Evaluation Patient Details Name: Alex Martinez MRN: [id number] DOB: [birth year] Today's Date: 01/29/2021  History of Present Illness  55 Y/O M with PMX of DM2, HTN, diastolic CHF, ETOH & Tobacco abuse, COPD, Pulm HTN, presents with worsening SOB, leg edema, and scrotal edema. He was seen for a similar presentation a few days ago and left without completing treatment.  Clinical Impression  Pt admitted with/for worsening SOB, leg and scrotal edema.  Pt needing min assist in general for basic mobility.  Pt currently limited functionally due to the problems listed below.  (see problems list.)  Pt will benefit from PT to maximize function and safety to be able to get home safely with available assist.        Recommendations for follow up therapy are one component of a multi-disciplinary discharge planning process, led by the attending physician.  Recommendations may be updated based on patient status, additional functional criteria and insurance authorization.  Follow Up Recommendations Home health PT    Assistance Recommended at Discharge Intermittent Supervision/Assistance  Functional Status Assessment Patient has had a recent decline in their functional status and demonstrates the ability to make significant improvements in function in a reasonable and predictable amount of time.  Equipment Recommendations  Other (comment) (TBA)    Recommendations for Other Services       Precautions / Restrictions Precautions Precautions: Fall Precaution Comments: watch O2 Restrictions Weight Bearing Restrictions: No      Mobility  Bed Mobility Overal bed mobility: Needs Assistance Bed Mobility: Supine to Sit     Supine to sit: Min assist          Transfers Overall transfer level: Needs assistance   Transfers: Sit to/from Stand;Stand Pivot Transfers Sit to Stand: Min assist Stand pivot transfers: Min assist              Ambulation/Gait              General Gait Details: wide based, mildly unsteady steps over to the chair  Stairs            Wheelchair Mobility    Modified Rankin (Stroke Patients Only)       Balance Overall balance assessment: Needs assistance Sitting-balance support: Feet supported;Single extremity supported Sitting balance-Leahy Scale: Fair     Standing balance support: Bilateral upper extremity supported Standing balance-Leahy Scale: Poor Standing balance comment: needing safety cues and RW                             Pertinent Vitals/Pain Pain Assessment: No/denies pain Body Movements: Restlessness    Home Living Family/patient expects to be discharged to:: Other (Comment) (Rental home) Living Arrangements: Alone Available Help at Discharge: Friend(s);Available PRN/intermittently Type of Home: House Home Access: Level entry       Home Layout: One level Home Equipment: None      Prior Function Prior Level of Function : Independent/Modified Independent             Mobility Comments: Patient did walk household distances without an AD.  Dose not drive, and relies on friends for transportation. ADLs Comments: Patient states was was independent for ADL and IADL completion.     Hand Dominance   Dominant Hand: Right    Extremity/Trunk Assessment   Upper Extremity Assessment Upper Extremity Assessment: Defer to OT evaluation    Lower Extremity Assessment Lower Extremity Assessment: Generalized weakness    Cervical / Trunk  Assessment Cervical / Trunk Assessment: Normal  Communication   Communication: No difficulties  Cognition Arousal/Alertness: Awake/alert Behavior During Therapy: Restless Overall Cognitive Status: Impaired/Different from baseline Area of Impairment: Memory;Following commands;Safety/judgement;Problem solving                     Memory: Decreased short-term memory Following Commands: Follows one step commands with increased  time Safety/Judgement: Decreased awareness of safety;Decreased awareness of deficits   Problem Solving: Slow processing;Requires verbal cues;Requires tactile cues          General Comments General comments (skin integrity, edema, etc.): vss    Exercises     Assessment/Plan    PT Assessment Patient needs continued PT services  PT Problem List Decreased strength;Decreased activity tolerance;Decreased balance;Decreased mobility;Cardiopulmonary status limiting activity       PT Treatment Interventions DME instruction;Gait training;Functional mobility training;Therapeutic activities;Balance training;Patient/family education    PT Goals (Current goals can be found in the Care Plan section)  Acute Rehab PT Goals Patient Stated Goal: I've got to get out of that house I'm renting and get access to the trust. PT Goal Formulation: With patient Time For Goal Achievement: 02/12/21 Potential to Achieve Goals: Fair    Frequency Min 3X/week   Barriers to discharge        Co-evaluation   Reason for Co-Treatment: Necessary to address cognition/behavior during functional activity;For patient/therapist safety;To address functional/ADL transfers;Complexity of the patient's impairments (multi-system involvement)   OT goals addressed during session: ADL's and self-care       AM-PAC PT "6 Clicks" Mobility  Outcome Measure Help needed turning from your back to your side while in a flat bed without using bedrails?: A Little Help needed moving from lying on your back to sitting on the side of a flat bed without using bedrails?: A Little Help needed moving to and from a bed to a chair (including a wheelchair)?: A Little Help needed standing up from a chair using your arms (e.g., wheelchair or bedside chair)?: A Little Help needed to walk in hospital room?: A Little Help needed climbing 3-5 steps with a railing? : A Little 6 Click Score: 18    End of Session   Activity Tolerance: Patient  tolerated treatment well Patient left: in chair;with call bell/phone within reach   PT Visit Diagnosis: Unsteadiness on feet (R26.81);Other abnormalities of gait and mobility (R26.89);Muscle weakness (generalized) (M62.81);Difficulty in walking, not elsewhere classified (R26.2)    Time: 6659-9357 PT Time Calculation (min) (ACUTE ONLY): 18 min   Charges:   PT Evaluation $PT Eval Moderate Complexity: 1 Mod          01/29/2021  Ginger Carne., PT Acute Rehabilitation Services [phone]  (pager) [phone]  (office)  Alex Martinez 01/29/2021, 4:30 PM

## 2021-01-30 ENCOUNTER — Inpatient Hospital Stay (HOSPITAL_COMMUNITY): Payer: Self-pay

## 2021-01-30 DIAGNOSIS — J441 Chronic obstructive pulmonary disease with (acute) exacerbation: Principal | ICD-10-CM

## 2021-01-30 DIAGNOSIS — R0902 Hypoxemia: Secondary | ICD-10-CM

## 2021-01-30 LAB — CBC
HCT: 44.8 % (ref 39.0–52.0)
Hemoglobin: 13.2 g/dL (ref 13.0–17.0)
MCH: 29.6 pg (ref 26.0–34.0)
MCHC: 29.5 g/dL — ABNORMAL LOW (ref 30.0–36.0)
MCV: 100.4 fL — ABNORMAL HIGH (ref 80.0–100.0)
Platelets: 257 10*3/uL (ref 150–400)
RBC: 4.46 MIL/uL (ref 4.22–5.81)
RDW: 15.8 % — ABNORMAL HIGH (ref 11.5–15.5)
WBC: 8.6 10*3/uL (ref 4.0–10.5)
nRBC: 0 % (ref 0.0–0.2)

## 2021-01-30 LAB — BASIC METABOLIC PANEL
Anion gap: 4 — ABNORMAL LOW (ref 5–15)
BUN: 16 mg/dL (ref 6–20)
CO2: 43 mmol/L — ABNORMAL HIGH (ref 22–32)
Calcium: 8.7 mg/dL — ABNORMAL LOW (ref 8.9–10.3)
Chloride: 86 mmol/L — ABNORMAL LOW (ref 98–111)
Creatinine, Ser: 0.73 mg/dL (ref 0.61–1.24)
GFR, Estimated: >60 mL/min (ref >60)
Glucose, Bld: 146 mg/dL — ABNORMAL HIGH (ref 70–99)
Potassium: 3.7 mmol/L (ref 3.5–5.1)
Sodium: 133 mmol/L — ABNORMAL LOW (ref 135–145)

## 2021-01-30 LAB — MAGNESIUM: Magnesium: 2.1 mg/dL (ref 1.7–2.4)

## 2021-01-30 LAB — GLUCOSE, CAPILLARY
Glucose-Capillary: 157 mg/dL — ABNORMAL HIGH (ref 70–99)
Glucose-Capillary: 134 mg/dL — ABNORMAL HIGH (ref 70–99)
Glucose-Capillary: 165 mg/dL — ABNORMAL HIGH (ref 70–99)
Glucose-Capillary: 137 mg/dL — ABNORMAL HIGH (ref 70–99)
Glucose-Capillary: 212 mg/dL — ABNORMAL HIGH (ref 70–99)
Glucose-Capillary: 179 mg/dL — ABNORMAL HIGH (ref 70–99)

## 2021-01-30 MED ORDER — POTASSIUM CHLORIDE CRYS ER 20 MEQ PO TBCR
40.0000 meq | EXTENDED_RELEASE_TABLET | Freq: Once | ORAL | Status: AC
  Administered 2021-01-30: 40 meq via ORAL
  Filled 2021-01-30: qty 2

## 2021-01-30 MED ORDER — UMECLIDINIUM BROMIDE 62.5 MCG/ACT IN AEPB
1.0000 | INHALATION_SPRAY | Freq: Every day | RESPIRATORY_TRACT | Status: DC
  Administered 2021-01-31 – 2021-02-03 (×4): 1 via RESPIRATORY_TRACT
  Filled 2021-01-30: qty 7

## 2021-01-30 MED ORDER — IOHEXOL 350 MG/ML SOLN
100.0000 mL | Freq: Once | INTRAVENOUS | Status: AC | PRN
  Administered 2021-01-30: 65 mL via INTRAVENOUS

## 2021-01-30 MED ORDER — TAMSULOSIN HCL 0.4 MG PO CAPS
0.4000 mg | ORAL_CAPSULE | Freq: Every day | ORAL | Status: DC
  Administered 2021-01-30 – 2021-02-04 (×6): 0.4 mg via ORAL
  Filled 2021-01-30 (×6): qty 1

## 2021-01-30 NOTE — Progress Notes (Signed)
Pt refused BiPAP states that he tried it before and does not like it. RN made aware.

## 2021-01-30 NOTE — Plan of Care (Signed)

## 2021-01-30 NOTE — Progress Notes (Signed)
Family Medicine Teaching Service Daily Progress Note Intern Pager: [phone]  Patient name: Alex Martinez Medical record number: [id number] Date of birth: 04/02/1966 Age: 55 y.o. Gender: male  Primary Care Provider: Leonard Downing, MD Consultants: Pulmonary Code Status: Full  Pt Overview and Major Events to Date:  01/28/2021-admitted  Assessment and Plan: Alex Martinez, is a 55 y.o. males who presented with shortness of breath and urinary retention.  PMH significant for COPD, right-sided heart failure, type 2 diabetes, alcohol use disorder, tobacco use disorder  RV dysfunction  WHO group 3 pulmonary HTN Pt has severe anasarca with severe pitting edema of BLEs, ultrasound done yesterday which ruled out DVT.  Severly enlarged RV with RV systolic pressure of 93.9. Received lasix 60 mg IV .  Potassium of 3.7 today, trending downward.  Yesterday. 24 hr UOP of 2,400 mL.  -continue diuresis, lasix 60  IV BID -Acetazolamide 500 mg twice daily for 2 days, first dose was 01/29/2021. -potassium chloride 40 mg once -Strict I's/O's -Daily weights  Acute on chronic hypercarbic respiratory failure secondary to likely COPD and CHF Never had formal COPD diagnosis or PFTs. Pt refused to wear BiPAP last night.  When I entered the room today, patient had nasal cannula off with oxygen saturations in the low 80s.  Nasal cannula was replaced and oxygen saturations returned to low 90s.  Echo from 01/10/21 showed EF of 60-65% with normal LV function, Currently requiring 6 L O2 via  with oxygen saturations in low 90s. Need to r/o CTEPH per PCCM.  -BiPAP nightly and prn -Duonebs 3 mL twice daily -incruse ellipta 1 puff daily -Prednisone 40 mg for 3 days, first dose on 10/27 -CTA chest  Urinary retention likey d/t BPH Urology was consulted on patient yesterday due to urinary retention of 622m on bladder scan.  Urology was able to place coud catheter.  I recommend beginning alpha-blocker  therapy with tamsulosin prior to removal of catheter, after 48 hours of alpha-blocker therapy, voiding trial monitoring post void residual volumes.  If less than 300 mL consistently, can be discharged on tamsulosin with outpatient follow-up with urology.  If greater than 300 mL, urology should be contacted. -flomax 0.4 daily -Follow urology's recommendations  Encephalopathy, multifactorial Encephalopathy likely due to hypercarbia.  Patient also has alcohol use disorder with history of withdrawal. -Continue CIWA protocol -Hold off on long-acting benzos, give Ativan as needed for withdrawal very cautiously -Aspiration precautions  Polysubstance abuse -CIWA protocol -Thiamin, folic acid, multivitamins  Type 2 diabetes Medication includes metformin 500 mg twice daily.  Hemoglobin A1c from 01/10/2021 of 6.7 -Very sensitive SSI -CBG monitoring 4 times daily, before meals and at bedtime  Goals of care PT recommends home PT  FEN/GI: Heart healthy PPx: Lovenox Dispo:Home pending clinical improvement .   Subjective:  Complains of swelling and states he wants to continue to get the fluid off and wants his Lasix increased.  Patient was coughing up sputum during exam.  He refuses to wear BiPAP at night as he states it is uncomfortable does not help.  When I entered the room patient had taken nasal cannula off and had oxygen saturations in the low 80s.  We discussed the importance of keeping nasal cannula on, patient agreed to be compliant.  Objective: Temp:  [97.7 F (36.5 C)-99.2 F (37.3 C)] 98.3 F (36.8 C) (10/28 0338) Pulse Rate:  [96-112] 100 (10/28 0338) Resp:  [16-28] 20 (10/28 0338) BP: (94-130)/(66-88) 124/79 (10/28 0338) SpO2:  [82 %-97 %]  97 % (10/28 0338) Physical Exam: General: Patient lying in bed, NAD Cardiovascular: Regular rhythm, tachycardic Respiratory: Unable to auscultate lungs as well as patient continued to humm while auscultating.  Normal work of breathing on 6  L Extremities: Severe pitting edema BLEs  Laboratory: Recent Labs  Lab 01/27/21 1701 01/28/21 0554 01/28/21 0707 01/29/21 0318 01/30/21 0235  WBC 7.1  --   --  8.0 8.6  HGB 15.1   < > 16.7 13.8 13.2  HCT 49.1   < > 49.0 46.5 44.8  PLT 294  --   --  263 257   < > = values in this interval not displayed.   Recent Labs  Lab 01/27/21 1810 01/28/21 0443 01/28/21 0554 01/28/21 0707 01/29/21 0318 01/30/21 0235  NA 132* 130*   < > 133* 136 133*  K 4.1 4.6   < > 3.8 4.5 3.7  CL 83* 84*  --   --  86* 86*  CO2 38* 40*  --   --  >45* 43*  BUN 16 13  --   --  10 16  CREATININE 0.78 0.69  --   --  0.65 0.73  CALCIUM 8.8* 8.4*  --   --  9.1 8.7*  PROT 6.8 6.0*  --   --   --   --   BILITOT 1.4* 1.8*  --   --   --   --   ALKPHOS 97 105  --   --   --   --   ALT 15 10  --   --   --   --   AST 26 44*  --   --   --   --   GLUCOSE 99 125*  --   --  91 146*   < > = values in this interval not displayed.      Precious Gilding, DO 01/30/2021, 7:04 AM PGY-1, [place] Intern pager: [phone], text pages welcome

## 2021-01-30 NOTE — Hospital Course (Addendum)
Alex Martinez is a 55 y.o. male presenting with SOB and urinary retention. PMH is significant for COPD, Right sided HF, T2DM, alcohol use disorder, tobacco use disorder.    Acute on chronic hypoxic respiratory failure  COPD  Right sided HF  pulmonary HTN Patient presented with acute on chronic dyspnea and had sats in the mid 80's per ED provider, requiring 5L [place]. On exam he had prominent wheezes with a prolonged expiratory phase as well as 2-3+ pitting edema to the proximal tibia and in his hips and 3+ scrotal edema. Workup notable for a BNP of 686 and an unremarkable CXR, no leukocytosis and afebrile.  Pt refused to wear BiPAP. Pt has hx of alcohol use disorder, was agitated in the ED and given ativan 1 mg. ABG showed pH 7.26, pCO2 106.9, pO2 148, bicarb 48.6. Pt became obtunded, PCCM consulted, was put on BiPAP and transferred to ICU. Pt remained in ICU for 1 night. U/S of LE r/o DVT. Pt was treated with oxygen via [place], acetazolamide 523m BID for 2 days, duonebs 3 mL BID, incruse ellipta, prednisone 40 mg for 3 days and IV lasix 60 mg BID. Pt was transitioned to PO Torsemide 60 mg BID. At discharge, pt had a net -32.7 L output for the admission. Patient discharged home on room air with minimal ankle pitting edema and an ambulatory pulse ox in the 89%, with clear lung exam.    Urinary Retention Urology was consulted on patient due to urinary retention of 6021mon bladder scan.  Urology was able to place coud catheter. They recommended alpha-blocker therapy with tamsulosin prior to removal of catheter. Patient passed voiding trial and had resolution of urinary retention. Pt will need to follow up with urology out patient.   Encephalopathy, multifactorial Encephalopathy likely due to hypercarbia.  Patient also has alcohol use disorder with history of withdrawal which could have play into AMS. Pt's mentation improved greatly throughout hospitalization.  Alcohol withdrawal Pt very cautiously  received ativan prn for withdrawal symptoms, was only given on day one and again on day 3.  Type 2 diabetes Pt treated with very sensitive SSI  and had CBG monitoring 4 times daily, before meals and at bedtime. Patient blood sugars well controlled throughout visit.   L1 fracture Evidence of a fracture involving L1 vertebral body was  Incidentally found on CTA from 01/30/21. X-ray following CTA showed age-indeterminate L1 compression deformity, unchanged since 01/09/2021 but new since 04/10/2018.    Pulmonary nodules Pt has hx of pulmonary nodules on CTA chest from 2020 requiring out patient follow up  Issues for follow up: Ensure follow up with pulmonary out patient  Ensure follow up with urology out patient Follow up for pulmonary nodules Follow up on L1 compression fracture

## 2021-01-30 NOTE — Progress Notes (Signed)
Pt refused to wear the BiPAP tonight

## 2021-01-30 NOTE — Progress Notes (Signed)
FPTS Interim Progress Note  S: patient found resting comfortably. Satting 99% on 6L [place].   O: BP 114/66 (BP Location: Right Arm)   Pulse (!) 105   Temp 99.3 F (37.4 C) (Oral)   Resp 18   Wt 249 lb 9 oz (113.2 kg)   SpO2 96%   BMI 35.81 kg/m    Physical exam General: NAD, resting peacefully Pulmonary: unlabored respirations  A/P: 55 year old male with AHRF likely COPD exacerbation requiring ICU stay. Found to have enlarged RV with pHTN. CTA ruled out CTEPH but showed an L1 fracture. Confirmed with MRI-L spine. -Continue COPD Rx: duonebs, Ellipta, and pred -Continue HF Rx: IV Lasix 60 BID -No changes, remainder per day team.   Corky Sox, MD PGY-1 [place] Intern pager: [phone], text pages welcome

## 2021-01-31 DIAGNOSIS — M549 Dorsalgia, unspecified: Secondary | ICD-10-CM

## 2021-01-31 LAB — GLUCOSE, CAPILLARY
Glucose-Capillary: 119 mg/dL — ABNORMAL HIGH (ref 70–99)
Glucose-Capillary: 120 mg/dL — ABNORMAL HIGH (ref 70–99)
Glucose-Capillary: 125 mg/dL — ABNORMAL HIGH (ref 70–99)
Glucose-Capillary: 130 mg/dL — ABNORMAL HIGH (ref 70–99)
Glucose-Capillary: 172 mg/dL — ABNORMAL HIGH (ref 70–99)
Glucose-Capillary: 231 mg/dL — ABNORMAL HIGH (ref 70–99)
Glucose-Capillary: 98 mg/dL (ref 70–99)

## 2021-01-31 LAB — CBC
HCT: 46.1 % (ref 39.0–52.0)
Hemoglobin: 13.9 g/dL (ref 13.0–17.0)
MCH: 30.1 pg (ref 26.0–34.0)
MCHC: 30.2 g/dL (ref 30.0–36.0)
MCV: 99.8 fL (ref 80.0–100.0)
Platelets: 244 10*3/uL (ref 150–400)
RBC: 4.62 MIL/uL (ref 4.22–5.81)
RDW: 15.7 % — ABNORMAL HIGH (ref 11.5–15.5)
WBC: 7.3 10*3/uL (ref 4.0–10.5)
nRBC: 0 % (ref 0.0–0.2)

## 2021-01-31 LAB — BASIC METABOLIC PANEL
Anion gap: 5 (ref 5–15)
BUN: 14 mg/dL (ref 6–20)
CO2: 41 mmol/L — ABNORMAL HIGH (ref 22–32)
Calcium: 9.1 mg/dL (ref 8.9–10.3)
Chloride: 89 mmol/L — ABNORMAL LOW (ref 98–111)
Creatinine, Ser: 0.68 mg/dL (ref 0.61–1.24)
GFR, Estimated: >60 mL/min (ref >60)
Glucose, Bld: 100 mg/dL — ABNORMAL HIGH (ref 70–99)
Potassium: 3.9 mmol/L (ref 3.5–5.1)
Sodium: 135 mmol/L (ref 135–145)

## 2021-01-31 LAB — MAGNESIUM: Magnesium: 2.2 mg/dL (ref 1.7–2.4)

## 2021-01-31 MED ORDER — POTASSIUM CHLORIDE CRYS ER 20 MEQ PO TBCR
40.0000 meq | EXTENDED_RELEASE_TABLET | Freq: Once | ORAL | Status: AC
  Administered 2021-01-31: 40 meq via ORAL
  Filled 2021-01-31: qty 2

## 2021-01-31 NOTE — Progress Notes (Signed)
Patient continues to pull off nasal cannula and has refused bipap from respiratory. Nurse, charge nurse and other staff have constantly been in the patients room to put his oxygen back on. Pt is quite confused and non-compliant.

## 2021-01-31 NOTE — Progress Notes (Signed)
Occupational Therapy Treatment Patient Details Name: Alex Martinez MRN: [id number] DOB: [birth year] Today's Date: 01/31/2021   History of present illness 55 Y/O M with PMX of DM2, HTN, diastolic CHF, ETOH & Tobacco abuse, COPD, Pulm HTN, presents with worsening SOB, leg edema, and scrotal edema. He was seen for a similar presentation a few days ago and left without completing treatment.   OT comments  Patient received in bed and demonstrated O2 sats at 86 with oxygen cannula on forehead. Therapist readjusted and instructed patient on breathing techniques.  Nursing states that patient had been taking of oxygen periodically. Patient instructed to get to eob from supine and would get off subject and had to be redirected. Once patient was able to get to eob he stated he felt dizzy and asked to return to supine.  BP was checked at 123/73.  Acute OT to continue to follow.      Recommendations for follow up therapy are one component of a multi-disciplinary discharge planning process, led by the attending physician.  Recommendations may be updated based on patient status, additional functional criteria and insurance authorization.    Follow Up Recommendations  Home health OT    Assistance Recommended at Discharge Intermittent Supervision/Assistance  Equipment Recommendations  [facility]    Recommendations for Other Services      Precautions / Restrictions Precautions Precautions: Fall Precaution Comments: watch O2       Mobility Bed Mobility Overal bed mobility: Needs Assistance Bed Mobility: Supine to Sit;Sit to Supine     Supine to sit: Min assist Sit to supine: Min assist   General bed mobility comments: patient required increased time to get to eob due to cues to stay on task    Transfers                         Balance Overall balance assessment: Needs assistance Sitting-balance support: Feet supported;Single extremity supported Sitting balance-Leahy Scale:  Fair Sitting balance - Comments: patient began getting dizzy while sitting on eob.                                   ADL either performed or assessed with clinical judgement   ADL                                               Vision       Perception     Praxis      Cognition Arousal/Alertness: Awake/alert Behavior During Therapy: Anxious;Restless Overall Cognitive Status: Impaired/Different from baseline Area of Impairment: Memory;Following commands;Safety/judgement;Problem solving                     Memory: Decreased short-term memory Following Commands: Follows one step commands inconsistently Safety/Judgement: Decreased awareness of safety;Decreased awareness of deficits   Problem Solving: Slow processing;Requires verbal cues;Requires tactile cues General Comments: required commands repeated , would talk in tangents          Exercises     Shoulder Instructions       General Comments BP checked at supine at 123/73    Pertinent Vitals/ Pain       Pain Assessment: Faces Faces Pain Scale: Hurts even more Facial Expression: Tense Body Movements: Protection Muscle Tension: Relaxed Compliance with  ventilator (intubated pts.): N/A Vocalization (extubated pts.): N/A CPOT Total: 2 Pain Location: scrotum Pain Descriptors / Indicators: Grimacing;Discomfort Pain Intervention(s): Monitored during session;Repositioned  Home Living                                          Prior Functioning/Environment              Frequency  Min 2X/week        Progress Toward Goals  OT Goals(current goals can now be found in the care plan section)  Progress towards OT goals: Not progressing toward goals - comment (unable to fully address goals due to cognitive status)  Acute Rehab OT Goals Patient Stated Goal: "I got to get out of here" OT Goal Formulation: With patient Time For Goal Achievement:  02/12/21 Potential to Achieve Goals: Good ADL Goals Pt Will Perform Grooming: with set-up;standing Pt Will Perform Lower Body Bathing: with set-up;sit to/from stand Pt Will Perform Lower Body Dressing: with set-up;sit to/from stand Pt Will Transfer to Toilet: with set-up;ambulating;regular height toilet Pt Will Perform Toileting - Clothing Manipulation and hygiene: with set-up;sit to/from stand Pt/caregiver will Perform Home Exercise Program: Increased strength;Both right and left upper extremity;With theraband;With written HEP provided  Plan Discharge plan remains appropriate    Co-evaluation                 AM-PAC OT "6 Clicks" Daily Activity     Outcome Measure   Help from another person eating meals?: None Help from another person taking care of personal grooming?: A Little Help from another person toileting, which includes using toliet, bedpan, or urinal?: A Little Help from another person bathing (including washing, rinsing, drying)?: A Lot Help from another person to put on and taking off regular upper body clothing?: None Help from another person to put on and taking off regular lower body clothing?: A Lot 6 Click Score: 18    End of Session Equipment Utilized During Treatment: Oxygen  OT Visit Diagnosis: Unsteadiness on feet (R26.81);Muscle weakness (generalized) (M62.81);Other symptoms and signs involving cognitive function   Activity Tolerance Patient limited by fatigue   Patient Left in bed;with call bell/phone within reach;with bed alarm set   Nurse Communication Other (comment) (on cognitive status)        Time: 3810-1751 OT Time Calculation (min): 24 min  Charges: OT General Charges $OT Visit: 1 Visit OT Treatments $Therapeutic Activity: 23-37 mins  Lodema Hong, [place]  Pager [phone] Office [place] 01/31/2021, 3:05 PM

## 2021-01-31 NOTE — Progress Notes (Signed)
Family Medicine Teaching Service Daily Progress Note Intern Pager: [phone]  Patient name: Alex Martinez Medical record number: [id number] Date of birth: [birth year] Age: 55 y.o. Gender: male  Primary Care Provider: Leonard Downing, MD Consultants: PCCM Code Status: full  Pt Overview and Major Events to Date:  01/28/2021-admitted  Assessment and Plan: Alex Martinez is a 55 year old male who presented with shortness of breath and urinary retention.  PMH significant for COPD, right-sided heart failure, type 2 diabetes, alcohol use disorder, tobacco use disorder  Right ventricular dysfunction  WHO grade 3 pulmonary hypertension Patient continued to have severe anasarca.  Patient received IV Lasix 60 mg twice daily yesterday with 24-hour urinary output of over 6 L. Weight 110.6 kg down from 114 kg on 01/28/21 Patient completed course of acetazolamide 500 mg twice daily for 2 days for contraction alkalosis.  Has bicarb of 41 today.  Potassium of 3.9 today and magnesium 2.2. -Continue diuresis, Lasix 60 mg IV twice daily -VBG today -Potassium chloride 40 mg once -Daily BMP -Strict I's/O's -Daily weights -PT/OT eval and treat  Acute on chronic hypercarbic respiratory failure secondary to likely COPD and CHF Patient continues to refuse to wear BiPAP nightly and continues to pull off nasal cannula.  Patient had CTA chest yesterday to rule out CTEPH.  No signs of CTEPH.   -DuoNebs 3 mL twice daily -Incruse Ellipta 1 puff daily -BiPAP nightly and as needed -Prednisone 40 mg for 3 days, first dose was on 10/27 -Continuous cardiac monitoring  L1 fracture Incidentally on CTA from yesterday, evidence of a burst fracture involving L1 vertebral body was found.  X-ray following CTA showed age-indeterminate L1 compression deformity, unchanged since 01/09/2021 but new since 04/10/2018.    Urinary retention likely due to [place] [facility] was consulted and placed daily catheter on  01/29/2021.  Patient was started on tamsulosin yesterday.  Patient will be on tamsulosin for total of 48 hours, catheter at this time can be removed with voiding trials monitoring post void residual volumes IF pt is alert.  If less than 300 mL consistently, patient can be discharged on tamsulosin with outpatient follow-up with urology.  If greater than 300 mL, urology should be contacted at hospital. -flomax 0.4 mg daily -Follow urology's recommendations -Bladder scans every shift  Encephalopathy, multifactorial Encephalopathy likely due to hypercarbia.  Patient is somewhat somnolent this morning.  Seems lethargic.  Mumbling, incoherent. Pt received Ativan 1 mg last night at 0140 d/t increased CIWA.  -Ativan d/c'd  -Aspiration precautions  Polysubstance abuse Pt received 1 mg ativan last night due to increased CIWA. As pt did not receive ativan at all day prior, less likely increased CIWA is truly due to withdrawal.  -d/c ativan -CIWA protocol -Thiamine, folic acid, multivitamins  Type 2 diabetes Home medication includes metformin 500 mg twice daily.  Hemoglobin A1c from 01/10/2021 of 6.7.  Glucose today of 98. -Very sensitive SSI -CBG monitoring 4 times daily, before meals and at bedtime  FEN/GI: Heart healthy PPx: Lovenox Dispo:Home with home health  pending clinical improvement .   Subjective:  Patient is somewhat somnolent this morning, not speaking clearly.  Only mumbling.  Hard to tell what patient is saying.  Objective: Temp:  [98.3 F (36.8 C)-99.3 F (37.4 C)] 98.3 F (36.8 C) (10/29 0436) Pulse Rate:  [96-105] 99 (10/29 0436) Resp:  [18-22] 19 (10/29 0436) BP: (114-134)/(66-83) 131/77 (10/29 0436) SpO2:  [93 %-98 %] 98 % (10/29 0436) Physical Exam: General: Patient is lying in  bed, NAD, very somnolent Cardiovascular: Extra heart sound, regular rate Respiratory: Could not auscultate from back due to patient's inability to sit up or roll over.  From front, CTAB, patient  appeared to be belly breathing Extremities: Severe pitting edema BLEs  Laboratory: Recent Labs  Lab 01/29/21 0318 01/30/21 0235 01/31/21 0058  WBC 8.0 8.6 7.3  HGB 13.8 13.2 13.9  HCT 46.5 44.8 46.1  PLT 263 257 244   Recent Labs  Lab 01/27/21 1810 01/28/21 0443 01/28/21 0554 01/29/21 0318 01/30/21 0235 01/31/21 0058  NA 132* 130*   < > 136 133* 135  K 4.1 4.6   < > 4.5 3.7 3.9  CL 83* 84*  --  86* 86* 89*  CO2 38* 40*  --  >45* 43* 41*  BUN 16 13  --  _0 CREATININE 0.78 0.69  --  0.65 0.73 0.68  CALCIUM 8.8* 8.4*  --  9.1 8.7* 9.1  PROT 6.8 6.0*  --   --   --   --   BILITOT 1.4* 1.8*  --   --   --   --   ALKPHOS 97 105  --   --   --   --   ALT 15 10  --   --   --   --   AST 26 44*  --   --   --   --   GLUCOSE 99 125*  --  91 146* 100*   < > = values in this interval not displayed.     Precious Gilding, DO 01/31/2021, 8:16 AM PGY-1, [place] Intern pager: [phone], text pages welcome

## 2021-01-31 NOTE — Progress Notes (Signed)
OT Cancellation Note  Patient Details Name: DERRYL UHER MRN: [id number] DOB: 04/27/1965   Cancelled Treatment:    Reason Eval/Treat Not Completed: Fatigue/lethargy limiting ability to participate (patient unable to arouse to participate. Will attempt again later today.) Lodema Hong, [place]  Pager [phone] Office [place] 01/31/2021, 8:32 AM

## 2021-01-31 NOTE — Plan of Care (Signed)
  Problem: Clinical Measurements: Goal: Cardiovascular complication will be avoided Outcome: Progressing   Problem: Health Behavior/Discharge Planning: Goal: Ability to manage health-related needs will improve Outcome: Not Progressing   Problem: Clinical Measurements: Goal: Respiratory complications will improve Outcome: Not Progressing

## 2021-01-31 NOTE — Progress Notes (Signed)
FPTS Interim Progress Note  S:Patient resting comfortably in bed, denies any concerns. Able to speak with nurse who also does not express any concerns.   O: BP 135/76 (BP Location: Right Arm)   Pulse 100   Temp 98.7 F (37.1 C) (Oral)   Resp 20   Wt 110.6 kg   SpO2 92%   BMI 34.99 kg/m   General: Patient resting comfortably in bed, in no acute distress.  CV: RRR, no murmurs or gallops auscultated Resp: CTAB, no wheezing noted, breathing comfortably on room air, nasal canula on his head and intermittently in place  Abdomen: soft, nontender, nondistended, presence of bowel sounds  Ext: radial and distal pulses strong and equal bilaterally, no pitting edema noted bilaterally  Neuro: AOx3 Psych: mood appropriate   A/P: 55 year old male with acute on chronic hypercarbic respiratory failure likely secondary to COPD and heart failure also with urinary retention. -BiPAP as able, although patient has consistently refused on prior nights -continue diuresis lasix 60 bid, monitor UOP -void trial 10/31, urology following  -continue breathing treatments (duonebs bid and incruse ellipta daily) -remainder of plan per day team   Donney Dice, DO 01/31/2021, 9:42 PM PGY-2, [place] Medicine Service pager [phone]

## 2021-02-01 DIAGNOSIS — R0902 Hypoxemia: Secondary | ICD-10-CM

## 2021-02-01 LAB — GLUCOSE, CAPILLARY
Glucose-Capillary: 125 mg/dL — ABNORMAL HIGH (ref 70–99)
Glucose-Capillary: 165 mg/dL — ABNORMAL HIGH (ref 70–99)
Glucose-Capillary: 112 mg/dL — ABNORMAL HIGH (ref 70–99)
Glucose-Capillary: 185 mg/dL — ABNORMAL HIGH (ref 70–99)
Glucose-Capillary: 168 mg/dL — ABNORMAL HIGH (ref 70–99)

## 2021-02-01 LAB — BASIC METABOLIC PANEL
Anion gap: 5 (ref 5–15)
BUN: 14 mg/dL (ref 6–20)
CO2: 38 mmol/L — ABNORMAL HIGH (ref 22–32)
Calcium: 9.1 mg/dL (ref 8.9–10.3)
Chloride: 92 mmol/L — ABNORMAL LOW (ref 98–111)
Creatinine, Ser: 0.59 mg/dL — ABNORMAL LOW (ref 0.61–1.24)
GFR, Estimated: >60 mL/min (ref >60)
Glucose, Bld: 118 mg/dL — ABNORMAL HIGH (ref 70–99)
Potassium: 4.1 mmol/L (ref 3.5–5.1)
Sodium: 135 mmol/L (ref 135–145)

## 2021-02-01 LAB — CBC
HCT: 46.5 % (ref 39.0–52.0)
Hemoglobin: 14.2 g/dL (ref 13.0–17.0)
MCH: 29.5 pg (ref 26.0–34.0)
MCHC: 30.5 g/dL (ref 30.0–36.0)
MCV: 96.5 fL (ref 80.0–100.0)
Platelets: 244 10*3/uL (ref 150–400)
RBC: 4.82 MIL/uL (ref 4.22–5.81)
RDW: 15.2 % (ref 11.5–15.5)
WBC: 6.4 10*3/uL (ref 4.0–10.5)
nRBC: 0 % (ref 0.0–0.2)

## 2021-02-01 LAB — MAGNESIUM: Magnesium: 2.4 mg/dL (ref 1.7–2.4)

## 2021-02-01 MED ORDER — IPRATROPIUM-ALBUTEROL 0.5-2.5 (3) MG/3ML IN SOLN: 3.0000 mL | Freq: Four times a day (QID) | RESPIRATORY_TRACT | Status: DC | PRN

## 2021-02-01 NOTE — Progress Notes (Signed)
Family Medicine Teaching Service Daily Progress Note Intern Pager: [phone]  Patient name: Alex Martinez Medical record number: [id number] Date of birth: 08/16/1965 Age: 55 y.o. Gender: male  Primary Care Provider: Leonard Downing, MD Consultants: PCCM Code Status: Full  Pt Overview and Major Events to Date:  10/22-admitted  Assessment and Plan: Alex Martinez Alex Martinez is a 55 year old male who presented with dyspnea and urinary retention.  PMH is significant for right-sided heart failure, COPD, type 2 diabetes, use and tobacco use disorder.  Right ventricular dysfunction  who grade 3 pulmonary hypertension Urine output in the last 24hr is about 6648m.  On exam patient still have generalize anasarca. Continue diuresis with IV Lasix and will consider switching to PO lasix tomorrow should patient continue to show significant improvement. Pt has 6kg weight loss since yesterday, his weight today is 104.1kg. -Continue IV Lasix 60 mg twice daily -Strict I's and O's -Daily BMP -Daily weight -PT/OT eval and treat  Acute on chronic hypercarbic respiratory failure 2/2 COPD and CHF Imaging rule out CTEPH.  Patient continues to decline BiPAP use.  Bicarb today is slightly improved at 38 from 423yesterday. Currently on 4L [place] with O2 sats in the high 90s.  On exam he had some intermittent wheezing.  He completed his 3-day course of prednisone 40 mg yesterday. -Wean O2 as tolerated. -DuoNeb 3 mill twice daily -Incruse Ellipta 1 puff daily -Continuous cardiac monitoring   Encephalopathy, multifactorial During his current stay pt appeared to be intermittently altered which could be a combination of his hypercarbic state or withdrawal from alcohol use disorder. This morning he is alert, oriented x3 and  has pleasant affect -Continue close monitoring of patient's status -Aspiration precaution   Urinary retention likely due to BPH Patient on daily catheter by urology.  Started on  tamsulosin.  Voiding trial scheduled for 10/31. -Urology following, appreciate recs -Continue Flomax 0.4 mg daily -Continue bladder scan every shift  L1 fracture Lumbar spine x-ray showed L1 compression fracture likely a component of osteoporosis.  Patient will follow-up outpatient for management.  Polysubstance abuse She has extensive history of alcohol and tobacco use. This morning he no signs of alcohol withdrawal symptoms and no recent CIWA score recorded.  Patient expressed craving for smoking and declined offer for nicotine patch.  FEN/GI: Heart healthy diet PPx: Lovenox Dispo:Home with home health  pending clinical improvement . Barriers include   Subjective:  Alex Martinez he is doing well this morning and in good spirit.  He has no complaint, other than he is still craving smoking tobacco. He decline nicotine patch.  Objective: Temp:  [98.1 F (36.7 C)-98.7 F (37.1 C)] 98.5 F (36.9 C) (10/30 0300) Pulse Rate:  [87-100] 87 (10/30 0300) Resp:  [19-25] 19 (10/30 0300) BP: (116-135)/(71-83) 116/79 (10/30 0300) SpO2:  [91 %-94 %] 94 % (10/30 0300) Weight:  [104.1 kg-110.6 kg] 104.1 kg (10/30 0300)  Physical Exam: General: Awake, laying in bed, NAD Cardiovascular: RRR, no murmurs, normal S1/S2 Respiratory: Diffuse crackles with intermittent wheezing.  Good WOB on 4L [place]. Abdomen: Soft, no distention or tenderness Extremities: BLE edema, +2 pedal and radial pulses.  Laboratory: Recent Labs  Lab 01/30/21 0235 01/31/21 0058 02/01/21 0517  WBC 8.6 7.3 6.4  HGB 13.2 13.9 14.2  HCT 44.8 46.1 46.5  PLT 257 244 244   Recent Labs  Lab 01/27/21 1810 01/28/21 0443 01/28/21 0554 01/30/21 0235 01/31/21 0058 02/01/21 0517  NA 132* 130*   < > 133* 135  135  K 4.1 4.6   < > 3.7 3.9 4.1  CL 83* 84*   < > 86* 89* 92*  CO2 38* 40*   < > 43* 41* 38*  BUN 16 13   < > _0 CREATININE 0.78 0.69   < > 0.73 0.68 0.59*  CALCIUM 8.8* 8.4*   < > 8.7* 9.1 9.1  PROT 6.8  6.0*  --   --   --   --   BILITOT 1.4* 1.8*  --   --   --   --   ALKPHOS 97 105  --   --   --   --   ALT 15 10  --   --   --   --   AST 26 44*  --   --   --   --   GLUCOSE 99 125*   < > 146* 100* 118*   < > = values in this interval not displayed.      Imaging/Diagnostic Tests: No results found.   [name], MD 02/01/2021, 7:56 AM PGY-1, [place] Intern pager: [phone], text pages welcome

## 2021-02-01 NOTE — TOC Initial Note (Signed)
Transition of Care [facility]) - Initial/Assessment Note    Patient Details  Name: Alex Martinez MRN: [id number] Date of Birth: 05-Dec-1965  [facility]) CM/SW Contact:    Bartholomew Crews, RN Phone Number: [phone] 02/01/2021, 4:14 PM  Clinical Narrative:                  Spoke with patient at the bedside to discuss transition planning. PTA home alone. Verified demographics and PCP.   Discussed monitoring oxygen needs. Will facilitate home oxygen through charity provider if needed. Educated patient about the importance of not smoking if he requires home oxygen.   Discussed recommendations for [facility] PT/OT. Patient agreeable. TOC to reach out to charity provider closer to time of transition home.   Patient will need MATCH for discharge medications. Noted history of [place] in 04/2018. Patient is eligible for MATCH at this time.   Referral to financial counselor for assistance with Medicaid.   Patient stated that a dear friend of his, Elton Sin, will provide transportation home at discharge. Her number is [phone].   TOC following for transition needs.   Expected Discharge Plan: [place] Barriers to Discharge: Continued Medical Work up   Patient Goals and CMS Choice Patient states their goals for this hospitalization and ongoing recovery are:: return home CMS Medicare.gov Compare Post Acute Care list provided to:: Patient Choice offered to / list presented to : Patient  Expected Discharge Plan and Services Expected Discharge Plan: [place]   Discharge Planning Services: CM Consult Post Acute Care Choice: Home Health, Durable Medical Equipment Living arrangements for the past 2 months: Single Family Home                                      Prior Living Arrangements/Services Living arrangements for the past 2 months: Single Family Home Lives with:: Self Patient language and need for interpreter reviewed:: Yes Do  you feel safe going back to the place where you live?: Yes      Need for Family Participation in Patient Care: Yes (Comment) Care giver support system in place?: Yes (comment)   Criminal Activity/Legal Involvement Pertinent to Current Situation/Hospitalization: No - Comment as needed  Activities of Daily Living      Permission Sought/Granted Permission sought to share information with : Case Manager, Family Supports Permission granted to share information with : Yes, Verbal Permission Granted              Emotional Assessment Appearance:: Appears stated age Attitude/Demeanor/Rapport: Engaged Affect (typically observed): Accepting Orientation: : Oriented to Self, Oriented to Place, Oriented to  Time, Oriented to Situation Alcohol / Substance Use: Tobacco Use Psych Involvement: No (comment)  Admission diagnosis:  Urinary retention [R33.9] Hypoxia [R09.02] COPD exacerbation (HCC) [J44.1] Respiratory failure, unspecified with hypoxia ([place]) [J96.91] Patient Active Problem List   Diagnosis Date Noted   Hypoxia    Respiratory failure, unspecified with hypoxia ([place]) 01/28/2021   COPD exacerbation ([place]) 01/28/2021   Urinary retention    Productive cough    Atherosclerosis of aorta ([place]) 01/10/2021   Uninsured 01/10/2021   COPD (chronic obstructive pulmonary disease) ([place]) 01/10/2021   Pulmonary artery hypertension ([place]) 01/10/2021   Acute exacerbation of chronic obstructive pulmonary disease (COPD) ([place]) 01/10/2021   Hypervolemia    Acute on chronic diastolic congestive heart failure (HCC)    Edema due to  congestive heart failure ([place])    Type 2 diabetes mellitus with hyperglycemia, without long-term current use of insulin ([place]) 04/17/2018   Hypertension associated with type 2 diabetes mellitus ([place]) 04/17/2018   Sinus tachycardia 04/17/2018   Acute respiratory failure with hypoxia (HCC)    Acute on chronic respiratory failure with hypoxia ([place]) 04/11/2018   Reactive airway  disease 04/11/2018   Pulmonary nodules 04/11/2018   Lymphadenopathy 04/11/2018   Alcohol use disorder, moderate, dependence ([place]) 04/11/2018   Tobacco dependence 04/11/2018   CAP (community acquired pneumonia) 04/10/2018   PCP:  Leonard Downing, MD Pharmacy:   [facility], [address] [place] [address] Phone: [phone] Fax: [phone]     Social Determinants of Health (SDOH) Interventions    Readmission Risk Interventions No flowsheet data found.

## 2021-02-02 LAB — BASIC METABOLIC PANEL
Anion gap: 4 — ABNORMAL LOW (ref 5–15)
BUN: 15 mg/dL (ref 6–20)
CO2: 40 mmol/L — ABNORMAL HIGH (ref 22–32)
Calcium: 8.9 mg/dL (ref 8.9–10.3)
Chloride: 92 mmol/L — ABNORMAL LOW (ref 98–111)
Creatinine, Ser: 0.69 mg/dL (ref 0.61–1.24)
GFR, Estimated: >60 mL/min (ref >60)
Glucose, Bld: 101 mg/dL — ABNORMAL HIGH (ref 70–99)
Potassium: 4.1 mmol/L (ref 3.5–5.1)
Sodium: 136 mmol/L (ref 135–145)

## 2021-02-02 LAB — GLUCOSE, CAPILLARY
Glucose-Capillary: 131 mg/dL — ABNORMAL HIGH (ref 70–99)
Glucose-Capillary: 193 mg/dL — ABNORMAL HIGH (ref 70–99)
Glucose-Capillary: 178 mg/dL — ABNORMAL HIGH (ref 70–99)
Glucose-Capillary: 202 mg/dL — ABNORMAL HIGH (ref 70–99)

## 2021-02-02 LAB — CULTURE, BLOOD (ROUTINE X 2)
Culture: NO GROWTH
Culture: NO GROWTH
Special Requests: ADEQUATE
Special Requests: ADEQUATE

## 2021-02-02 LAB — CBC
HCT: 51.4 % (ref 39.0–52.0)
Hemoglobin: 16 g/dL (ref 13.0–17.0)
MCH: 29.9 pg (ref 26.0–34.0)
MCHC: 31.1 g/dL (ref 30.0–36.0)
MCV: 95.9 fL (ref 80.0–100.0)
Platelets: 263 10*3/uL (ref 150–400)
RBC: 5.36 MIL/uL (ref 4.22–5.81)
RDW: 15.3 % (ref 11.5–15.5)
WBC: 6.4 10*3/uL (ref 4.0–10.5)
nRBC: 0 % (ref 0.0–0.2)

## 2021-02-02 LAB — MAGNESIUM: Magnesium: 2.2 mg/dL (ref 1.7–2.4)

## 2021-02-02 MED ORDER — INSULIN ASPART 100 UNIT/ML IJ SOLN
0.0000 [IU] | Freq: Three times a day (TID) | INTRAMUSCULAR | Status: DC
  Administered 2021-02-03: 1 [IU] via SUBCUTANEOUS
  Administered 2021-02-03: 3 [IU] via SUBCUTANEOUS
  Administered 2021-02-04: 4 [IU] via SUBCUTANEOUS
  Administered 2021-02-04: 3 [IU] via SUBCUTANEOUS

## 2021-02-02 MED ORDER — FUROSEMIDE 40 MG PO TABS: 80.0000 mg | ORAL_TABLET | Freq: Three times a day (TID) | ORAL | Status: DC

## 2021-02-02 MED ORDER — ENOXAPARIN SODIUM 60 MG/0.6ML IJ SOSY
50.0000 mg | PREFILLED_SYRINGE | Freq: Every day | INTRAMUSCULAR | Status: DC
  Administered 2021-02-02 – 2021-02-04 (×3): 50 mg via SUBCUTANEOUS
  Filled 2021-02-02 (×3): qty 0.6

## 2021-02-02 MED ORDER — TORSEMIDE 20 MG PO TABS
60.0000 mg | ORAL_TABLET | Freq: Two times a day (BID) | ORAL | Status: DC
  Administered 2021-02-02 – 2021-02-04 (×5): 60 mg via ORAL
  Filled 2021-02-02 (×5): qty 3

## 2021-02-02 NOTE — Progress Notes (Signed)
FPTS Interim Progress Note  S:Patient seen at bedside during nighttime rounds. He was sleeping comfortably. Did not wake patient to examine him.  O: BP 124/85 (BP Location: Left Arm)   Pulse 94   Temp 98.5 F (36.9 C) (Oral)   Resp 17   Wt 104.1 kg   SpO2 93%   BMI 32.93 kg/m   General: Sleeping in bed, snoring CV: Regular rate Resp: Normal work of breathing on 3L [place] with SpO2 89%   A/P: Alex Martinez is a 55 year old male who presented with dyspnea and urinary retention, receiving IV diuresis and COPD treatment. - Void trial in AM, will d/c [place] at 6:00am - Monitor respiratory status closely - Monitor VS  Labs and orders reviewed. No new changes.  Orvis Brill, DO 02/02/2021, 1:20 AM PGY-1, [place] Medicine Service pager [phone]

## 2021-02-02 NOTE — Progress Notes (Signed)
Occupational Therapy Treatment Patient Details Name: Alex Martinez MRN: [id number] DOB: [birth year] Today's Date: 02/02/2021   History of present illness 55 Y/O M with PMX of DM2, HTN, diastolic CHF, ETOH & Tobacco abuse, COPD, Pulm HTN, presents with worsening SOB, leg edema, and scrotal edema. He was seen for a similar presentation a few days ago and left without completing treatment.   OT comments  Patient received in bed and demonstrated increased awareness of time and situation. Patient was required frequent cues for breathing technique with O2 dropping to 86-87 and increasing to 90-92 with proper breathing techniques. Patient performed bed mobility, sitting balance, and toilet transfers with min assist for standing and transfers. Acute OT to continue to follow.    Recommendations for follow up therapy are one component of a multi-disciplinary discharge planning process, led by the attending physician.  Recommendations may be updated based on patient status, additional functional criteria and insurance authorization.    Follow Up Recommendations  Home health OT    Assistance Recommended at Discharge Intermittent Supervision/Assistance  Equipment Recommendations  [facility]    Recommendations for Other Services      Precautions / Restrictions Precautions Precautions: Fall Precaution Comments: watch O2 Restrictions Weight Bearing Restrictions: No       Mobility Bed Mobility Overal bed mobility: Needs Assistance Bed Mobility: Supine to Sit;Sit to Supine     Supine to sit: Min guard Sit to supine: Min guard   General bed mobility comments: able to get to eob with min guard for safety    Transfers Overall transfer level: Needs assistance Equipment used: Rolling walker (2 wheels) Transfers: Sit to/from [facility] Sit to Stand: Min assist Stand pivot transfers: Min assist         General transfer comment: performed transfer to [facility] from eob with rw      Balance Overall balance assessment: Needs assistance Sitting-balance support: Feet supported;Single extremity supported Sitting balance-Leahy Scale: Fair Sitting balance - Comments: able to sit on eob without assistance   Standing balance support: Bilateral upper extremity supported Standing balance-Leahy Scale: Poor Standing balance comment: needing safety cues and RW                           ADL either performed or assessed with clinical judgement   ADL Overall ADL's : Needs assistance/impaired     Grooming: Wash/dry hands;Set up;Sitting Grooming Details (indicate cue type and reason): performed seated on eob                 Toilet Transfer: Minimal assistance;Rolling walker (2 wheels) Toilet Transfer Details (indicate cue type and reason): performed to [facility] next to bed           General ADL Comments: grooming and toileting performed with cues for breathing technique through out     Vision       Perception     Praxis      Cognition Arousal/Alertness: Awake/alert Behavior During Therapy: Anxious;Restless Overall Cognitive Status: Impaired/Different from baseline Area of Impairment: Memory;Following commands;Safety/judgement;Problem solving                     Memory: Decreased short-term memory Following Commands: Follows one step commands with increased time Safety/Judgement: Decreased awareness of safety;Decreased awareness of deficits   Problem Solving: Slow processing;Requires verbal cues;Requires tactile cues General Comments: more oriented on this date than previous visit          Exercises  Shoulder Instructions       General Comments O2 sats in upper 80s and low 90s with verbal cues for breathing with cannula    Pertinent Vitals/ Pain       Pain Assessment: Faces Faces Pain Scale: Hurts even more Facial Expression: Relaxed, neutral Body Movements: Protection Muscle Tension: Relaxed Compliance with ventilator  (intubated pts.): N/A Vocalization (extubated pts.): N/A CPOT Total: 1 Pain Location: scrotum Pain Descriptors / Indicators: Grimacing;Discomfort Pain Intervention(s): Repositioned  Home Living                                          Prior Functioning/Environment              Frequency  Min 2X/week        Progress Toward Goals  OT Goals(current goals can now be found in the care plan section)  Progress towards OT goals: Progressing toward goals  Acute Rehab OT Goals Patient Stated Goal: not stated OT Goal Formulation: With patient Time For Goal Achievement: 02/12/21 Potential to Achieve Goals: Good ADL Goals Pt Will Perform Grooming: with set-up;standing Pt Will Perform Lower Body Bathing: with set-up;sit to/from stand Pt Will Perform Lower Body Dressing: with set-up;sit to/from stand Pt Will Transfer to Toilet: with set-up;ambulating;regular height toilet Pt Will Perform Toileting - Clothing Manipulation and hygiene: with set-up;sit to/from stand Pt/caregiver will Perform Home Exercise Program: Increased strength;Both right and left upper extremity;With theraband;With written HEP provided  Plan Discharge plan remains appropriate    Co-evaluation                 AM-PAC OT "6 Clicks" Daily Activity     Outcome Measure   Help from another person eating meals?: None Help from another person taking care of personal grooming?: A Little Help from another person toileting, which includes using toliet, bedpan, or urinal?: A Little Help from another person bathing (including washing, rinsing, drying)?: A Lot Help from another person to put on and taking off regular upper body clothing?: None Help from another person to put on and taking off regular lower body clothing?: A Lot 6 Click Score: 18    End of Session Equipment Utilized During Treatment: Rolling walker (2 wheels);Oxygen  OT Visit Diagnosis: Unsteadiness on feet (R26.81);Muscle  weakness (generalized) (M62.81);Other symptoms and signs involving cognitive function   Activity Tolerance Patient limited by fatigue   Patient Left in bed;with call bell/phone within reach;with bed alarm set   Nurse Communication Mobility status        Time: 1000-1036 OT Time Calculation (min): 36 min  Charges: OT General Charges $OT Visit: 1 Visit OT Treatments $Self Care/Home Management : 23-37 mins  Lodema Hong, [place]  Pager [phone] Office [place] 02/02/2021, 10:46 AM

## 2021-02-02 NOTE — Progress Notes (Addendum)
Family Medicine Teaching Service Daily Progress Note Intern Pager: [phone]  Patient name: Alex Martinez Medical record number: [id number] Date of birth: 18-Sep-1965 Age: 55 y.o. Gender: male  Primary Care Provider: Leonard Downing, MD Consultants:PCCM Code Status: Full  Pt Overview and Major Events to Date:  01/24/21-Admitted  Assessment and Plan: Alex Martinez is a 55 year old male who presented with dyspnea and urinary retention.  PMH significant for right-sided heart failure, COPD, type 2 diabetes, tobacco use disorder  Right ventricular dysfunction   Grade 3 pulmonary hypertension Patient is currently getting diuresed with IV Lasix 60 mg twice daily.  Can consider switching to p.o. Lasix today.  Patient weight is stable from yesterday at 104.1 kg, has lost total of 9.9 Kg since admission.  Total 24 hr urinary output 7.9 L.  Electrolytes are stable. -Transition to oral Torsemide 60 mg BID starting this evening -Strict I's/O's -Daily BMP -Daily lites -PT/OT eval and treat  Acute on chronic hypercarbic respiratory failure secondary to COPD and CHF Patient continues to decline BiPAP at night.  Currently on 3 liters  with O2 sats in the low 90's.  Trialed patient off of oxygen and he desatted into the mid 80s. -Wean O2 as tolerated -DuoNebs 3 L twice daily -Incruse Ellipta 1 puff daily -Continuous cardiac monitoring  Urinary retention likely due to BPH Patient has been on tamsulosin daily for the past 4 days.  Per urology, we will remove catheter and monitor postvoid residual volumes with bladder scans.  IF post void residual volume is <300 mL, patient can continue tamsulosin.  If > 300 mL, we will contact urology. We will pull catheter tomorrow after he has been transitioned successfully to PO torsemide.  -Urology following, appreciate recs -Continue Flomax 0.4 mg daily -Remove catheter tomorrow and began post void residual volume bladder scans  L1 fracture L1  compression fracture seen on lumbar x-ray.  Likely due to osteoporosis, patient can follow-up outpatient.  Polysubstance abuse Significant history of alcohol and tobacco use.  We will no longer record CIWA's as patient has no withdrawal symptoms and this is day 10 of hospitalization.  FEN/GI: Heart healthy diet PPx: Lovenox Dispo:Home with home health  pending clinical improvement .  Subjective:  Patient states he has no shortness of breath on 3 L.  States he is concerned about going home because he knows he will want to smoke and if he has to go home with oxygen, this could be dangerous.  Objective: Temp:  [97.7 F (36.5 C)-99.1 F (37.3 C)] 98 F (36.7 C) (10/31 0405) Pulse Rate:  [89-99] 94 (10/31 0405) Resp:  [15-19] 19 (10/31 0405) BP: (111-131)/(77-95) 128/82 (10/31 0405) SpO2:  [88 %-96 %] 90 % (10/31 0405) Weight:  [104.1 kg] 104.1 kg (10/31 0405) Physical Exam: General: Patient is very alert, lying in bed, NAD Cardiovascular: Additional heart sound, regular rate Respiratory: Normal work of breathing on 3 L.  Difficult to auscultate as patient hums during exhalation consistently Abdomen: Soft, nondistended, mild diffuse tenderness on palpation Extremities: No pitting edema of BLEs Genitourinary: Significantly decreased scrotal swelling  Laboratory: Recent Labs  Lab 01/31/21 0058 02/01/21 0517 02/02/21 0115  WBC 7.3 6.4 6.4  HGB 13.9 14.2 16.0  HCT 46.1 46.5 51.4  PLT 244 244 263   Recent Labs  Lab 01/27/21 1810 01/28/21 0443 01/28/21 0554 01/31/21 0058 02/01/21 0517 02/02/21 0115  NA 132* 130*   < > 135 135 136  K 4.1 4.6   < > 3.9  4.1 4.1  CL 83* 84*   < > 89* 92* 92*  CO2 38* 40*   < > 41* 38* 40*  BUN 16 13   < > _0 CREATININE 0.78 0.69   < > 0.68 0.59* 0.69  CALCIUM 8.8* 8.4*   < > 9.1 9.1 8.9  PROT 6.8 6.0*  --   --   --   --   BILITOT 1.4* 1.8*  --   --   --   --   ALKPHOS 97 105  --   --   --   --   ALT 15 10  --   --   --   --   AST  26 44*  --   --   --   --   GLUCOSE 99 125*   < > 100* 118* 101*   < > = values in this interval not displayed.    Precious Gilding, DO 02/02/2021, 7:56 AM PGY-1, [place] Intern pager: [phone], text pages welcome

## 2021-02-02 NOTE — Progress Notes (Signed)
Physical Therapy Treatment Patient Details Name: Alex Martinez MRN: [id number] DOB: [birth year] Today's Date: 02/02/2021   History of Present Illness 55 Y/O M with PMX of DM2, HTN, diastolic CHF, ETOH & Tobacco abuse, COPD, Pulm HTN, presents with worsening SOB, leg edema, and scrotal edema. He was seen for a similar presentation a few days ago and left without completing treatment.    PT Comments    Pt required min guard assist bed mobility, transfers, and ambulation 15' with RW. Pt on 2L continuous O2 with SpO2 in 90s. He completed LE exercises supine and standing. Continues to demonstrate deficits in balance, activity tolerance, and safety awareness. Easily distracted, requiring cues to stay on task. Pt in recliner, set up with lunch tray, at end of session.    Recommendations for follow up therapy are one component of a multi-disciplinary discharge planning process, led by the attending physician.  Recommendations may be updated based on patient status, additional functional criteria and insurance authorization.  Follow Up Recommendations  Home health PT     Assistance Recommended at Discharge Intermittent Supervision/Assistance  Equipment Recommendations  Rolling walker (2 wheels)    Recommendations for Other Services       Precautions / Restrictions Precautions Precautions: Fall;Other (comment) Precaution Comments: watch O2 Restrictions Weight Bearing Restrictions: No     Mobility  Bed Mobility Overal bed mobility: Needs Assistance Bed Mobility: Supine to Sit     Supine to sit: Min guard;HOB elevated Sit to supine: Min guard   General bed mobility comments: min guard for safety/lines, cues for sequencing    Transfers Overall transfer level: Needs assistance Equipment used: Rolling walker (2 wheels) Transfers: Sit to/from Stand Sit to Stand: Min guard Stand pivot transfers: Min assist         General transfer comment: cues for sequencing, increased  time    Ambulation/Gait Ambulation/Gait assistance: Min guard Gait Distance (Feet): 15 Feet Assistive device: Rolling walker (2 wheels) Gait Pattern/deviations: Step-through pattern;Decreased stride length;Wide base of support Gait velocity: decreased   General Gait Details: Mobilized on 2L with SpO2 in 90s. Distance limited by anxiety, perseveration on need to have BM. Difficulty focusing and staying on task.   Stairs             Wheelchair Mobility    Modified Rankin (Stroke Patients Only)       Balance Overall balance assessment: Needs assistance Sitting-balance support: Feet supported;No upper extremity supported Sitting balance-Leahy Scale: Good Sitting balance - Comments: able to sit on eob without assistance   Standing balance support: During functional activity;Single extremity supported;Bilateral upper extremity supported Standing balance-Leahy Scale: Poor Standing balance comment: reliant on UE support                            Cognition Arousal/Alertness: Awake/alert Behavior During Therapy: Anxious Overall Cognitive Status: Impaired/Different from baseline Area of Impairment: Memory;Following commands;Safety/judgement;Problem solving;Attention                   Current Attention Level: Selective Memory: Decreased short-term memory Following Commands: Follows one step commands with increased time Safety/Judgement: Decreased awareness of safety;Decreased awareness of deficits   Problem Solving: Slow processing;Requires verbal cues;Requires tactile cues General Comments: difficulty staying on task, easily distracted, perseverating on foley catheter as well as need to have a BM        Exercises General Exercises - Lower Extremity Ankle Circles/Pumps: AROM;Both;10 reps;Supine Heel Slides: AROM;Right;Left;10 reps;Supine Hip ABduction/ADduction:  AROM;Both;10 reps;Supine Straight Leg Raises: AROM;Right;Left;10 reps;Supine Hip  Flexion/Marching: AROM;Right;Left;10 reps;Standing (with RW) Heel Raises: AROM;Both;10 reps;Standing (with RW)    General Comments General comments (skin integrity, edema, etc.): SpO2 in 90s on 2L      Pertinent Vitals/Pain Pain Assessment: Faces Faces Pain Scale: Hurts even more Facial Expression: Relaxed, neutral Body Movements: Protection Muscle Tension: Relaxed Compliance with ventilator (intubated pts.): N/A Vocalization (extubated pts.): N/A CPOT Total: 1 Pain Location: scrotum, groin area with mobility Pain Descriptors / Indicators: Grimacing;Discomfort Pain Intervention(s): Monitored during session;Repositioned    Home Living                          Prior Function            PT Goals (current goals can now be found in the care plan section) Acute Rehab PT Goals Patient Stated Goal: decrease pain and swelling Progress towards PT goals: Progressing toward goals    Frequency    Min 3X/week      PT Plan Current plan remains appropriate    Co-evaluation              AM-PAC PT "6 Clicks" Mobility   Outcome Measure  Help needed turning from your back to your side while in a flat bed without using bedrails?: None Help needed moving from lying on your back to sitting on the side of a flat bed without using bedrails?: A Little Help needed moving to and from a bed to a chair (including a wheelchair)?: A Little Help needed standing up from a chair using your arms (e.g., wheelchair or bedside chair)?: A Little Help needed to walk in hospital room?: A Little Help needed climbing 3-5 steps with a railing? : A Little 6 Click Score: 19    End of Session Equipment Utilized During Treatment: Gait belt;Oxygen Activity Tolerance: Patient tolerated treatment well Patient left: in chair;with call bell/phone within reach;with chair alarm set Nurse Communication: Mobility status PT Visit Diagnosis: Unsteadiness on feet (R26.81);Other abnormalities of gait  and mobility (R26.89);Muscle weakness (generalized) (M62.81);Difficulty in walking, not elsewhere classified (R26.2)     Time: 7065-8260 PT Time Calculation (min) (ACUTE ONLY): 30 min  Charges:  $Gait Training: 8-22 mins $Therapeutic Exercise: 8-22 mins                     Lorrin Goodell, PT  Office # [phone] Pager [phone]    Lorriane Shire 02/02/2021, 12:09 PM

## 2021-02-03 LAB — GLUCOSE, CAPILLARY
Glucose-Capillary: 145 mg/dL — ABNORMAL HIGH (ref 70–99)
Glucose-Capillary: 167 mg/dL — ABNORMAL HIGH (ref 70–99)
Glucose-Capillary: 193 mg/dL — ABNORMAL HIGH (ref 70–99)
Glucose-Capillary: 211 mg/dL — ABNORMAL HIGH (ref 70–99)
Glucose-Capillary: 238 mg/dL — ABNORMAL HIGH (ref 70–99)
Glucose-Capillary: 281 mg/dL — ABNORMAL HIGH (ref 70–99)

## 2021-02-03 LAB — BASIC METABOLIC PANEL
Anion gap: 11 (ref 5–15)
BUN: 15 mg/dL (ref 6–20)
CO2: 36 mmol/L — ABNORMAL HIGH (ref 22–32)
Calcium: 9.1 mg/dL (ref 8.9–10.3)
Chloride: 89 mmol/L — ABNORMAL LOW (ref 98–111)
Creatinine, Ser: 0.69 mg/dL (ref 0.61–1.24)
GFR, Estimated: >60 mL/min (ref >60)
Glucose, Bld: 119 mg/dL — ABNORMAL HIGH (ref 70–99)
Potassium: 4 mmol/L (ref 3.5–5.1)
Sodium: 136 mmol/L (ref 135–145)

## 2021-02-03 LAB — MAGNESIUM: Magnesium: 1.9 mg/dL (ref 1.7–2.4)

## 2021-02-03 MED ORDER — NICOTINE POLACRILEX 2 MG MT GUM
2.0000 mg | CHEWING_GUM | OROMUCOSAL | Status: DC | PRN
  Filled 2021-02-03: qty 1

## 2021-02-03 NOTE — Progress Notes (Signed)
FPTS Interim Progress Note  S:Patient seen and evaluated at bedside on nighttime rounds. He was hungry and requesting food.  O: BP 136/74 (BP Location: Left Arm)   Pulse 94   Temp 98 F (36.7 C) (Oral)   Resp 19   Wt 104.1 kg   SpO2 95%   BMI 32.93 kg/m   General: awake, alert, in no distress Resp: On 3L [place] with SpO2 94%, speaking in full sentences, no increased work of breathing  A/P: RV dysfunction/ Acute on Chronic RF 2/2 COPD and CHF Patient stable. Continue current management per day team - On 3 L [place], wean as tolerated, goal sats 88-92% - Duonebs - Ellipta - PO Torsemide  Labs and orders reviewed. No new changes.  Orvis Brill, DO 02/03/2021, 12:27 AM PGY-1, [place] Medicine Service pager [phone]

## 2021-02-03 NOTE — Progress Notes (Signed)
Foley removed. Pt tolerated well. Will continue to monitor   Phoebe Sharps, RN

## 2021-02-03 NOTE — Progress Notes (Signed)
Patient ID: Alex Martinez, male   DOB: 1965/05/22, 55 y.o.   MRN: [id number]    Subjective: Pt still with Foley catheter indwelling.  Objective: Vital signs in last 24 hours: Temp:  [98 F (36.7 C)-99.2 F (37.3 C)] 99 F (37.2 C) (11/01 1116) Pulse Rate:  [89-101] 96 (11/01 1116) Resp:  [13-20] 19 (11/01 1116) BP: (102-136)/(65-88) 106/85 (11/01 1116) SpO2:  [86 %-95 %] 90 % (11/01 1116) Weight:  [98.1 kg] 98.1 kg (11/01 0653)  Intake/Output from previous day: 10/31 0701 - 11/01 0700 In: 240 [P.O.:240] Out: 4650 [Urine:4650] Intake/Output this shift: Total I/O In: 240 [P.O.:240] Out: 1000 [Urine:1000]  Physical Exam:  General: Alert and oriented GU: Urine grossly clear  Lab Results: Recent Labs    02/01/21 0517 02/02/21 0115  HGB 14.2 16.0  HCT 46.5 51.4   BMET Recent Labs    02/02/21 0115 02/03/21 0207  NA 136 136  K 4.1 4.0  CL 92* 89*  CO2 40* 36*  GLUCOSE 101* 119*  BUN 15 15  CREATININE 0.69 0.69  CALCIUM 8.9 9.1     Studies/Results: No results found.  Assessment/Plan: 1) Urinary retention likely secondary to BPH: Continue tamsulosin. Voiding trial with monitoring of PVRs when medically appropriate.   LOS: 6 days   Dutch Gray 02/03/2021, 1:06 PM

## 2021-02-03 NOTE — Progress Notes (Signed)
Family Medicine Teaching Service Daily Progress Note Intern Pager: [phone]  Patient name: Alex Martinez Medical record number: [id number] Date of birth: 28-Oct-1965 Age: 55 y.o. Gender: male  Primary Care Provider: Leonard Downing, MD Consultants: PCCM Code Status: Full  Pt Overview and Major Events to Date:  01/24/21-Admitted  Assessment and Plan:  Alex Martinez is a 55 year old male who presented with dyspnea and urinary retention.  PMH significant for right-sided heart failure, COPD, type 2 diabetes, tobacco use disorder   Right ventricular dysfunction   Grade 3 pulmonary hypertension Patient complained of anxiety to nurse but had no complaints when I saw him twice during the day. Patient still receiving lasix and has dropped weight from 1.41 kg to 98.1 kg. His urin output was 4.6 L yesterday, he is net - 29.3 L for his admission.  -Daily Weights -Continue PO torsemide 60 mg BID -Strict I/O's -Daily BMP  -PT/OT eval and treat  Acute on chronic hypercarbic respiratory failure secondary to COPD and CHF Currently on RA with O2 sats in 90s. Patient was weaned down from 1 L this am. He reports no shortness of breath or increased work of breathing.  -DuoNebs 3L twice daily -Ellipta 1 puff daily -CCM   Urinary retention likely due to BPH Patient to have voiding trial today.  If less than 300 mL patient to continue tamsulosin.  If greater than 300 mL, we will contact urology -Appreciate urology recs -Continue Flomax 0.4 daily  DM2 Very sensitive SSI  L1 Fracture Found on lumbar x-ray, follow up outpatient  Polysubstance abuse Patient with history of alcohol and tobacco use. No longer on CIWA's as patient had no withdrawal symptoms since hospitalization   FEN/GI: Heart healthy diet PPx: Lovenox Dispo:Home with home health  pending clinical improvement .    Subjective:  Requesting medicine for his anxiety.   Objective: Temp:  [98 F (36.7 C)-99.2 F  (37.3 C)] 98.2 F (36.8 C) (11/01 0737) Pulse Rate:  [89-101] 89 (11/01 0737) Resp:  [13-20] 18 (11/01 0737) BP: (102-136)/(65-88) 122/88 (11/01 0737) SpO2:  [86 %-95 %] 92 % (11/01 0821) Weight:  [98.1 kg] 98.1 kg (11/01 0653) Physical Exam: General: Well appearing white male, in no signs of distress. Cardiovascular: RRR, NRMG Respiratory: CTABL, no wheezes, rhonchi or stridor Abdomen: Soft, NTTP Extremities: Pulses intact in all extremities  Laboratory: Recent Labs  Lab 01/31/21 0058 02/01/21 0517 02/02/21 0115  WBC 7.3 6.4 6.4  HGB 13.9 14.2 16.0  HCT 46.1 46.5 51.4  PLT 244 244 263   Recent Labs  Lab 01/27/21 1810 01/28/21 0443 01/28/21 0554 02/01/21 0517 02/02/21 0115 02/03/21 0207  NA 132* 130*   < > 135 136 136  K 4.1 4.6   < > 4.1 4.1 4.0  CL 83* 84*   < > 92* 92* 89*  CO2 38* 40*   < > 38* 40* 36*  BUN 16 13   < > _0 CREATININE 0.78 0.69   < > 0.59* 0.69 0.69  CALCIUM 8.8* 8.4*   < > 9.1 8.9 9.1  PROT 6.8 6.0*  --   --   --   --   BILITOT 1.4* 1.8*  --   --   --   --   ALKPHOS 97 105  --   --   --   --   ALT 15 10  --   --   --   --   AST 26 44*  --   --   --   --  GLUCOSE 99 125*   < > 118* 101* 119*   < > = values in this interval not displayed.      Imaging/Diagnostic Tests:   Holley Bouche, MD 02/03/2021, 9:09 AM PGY-1, [place] Intern pager: [phone], text pages welcome

## 2021-02-04 ENCOUNTER — Other Ambulatory Visit (HOSPITAL_COMMUNITY): Payer: Self-pay

## 2021-02-04 LAB — BASIC METABOLIC PANEL
Anion gap: 8 (ref 5–15)
BUN: 18 mg/dL (ref 6–20)
CO2: 38 mmol/L — ABNORMAL HIGH (ref 22–32)
Calcium: 9 mg/dL (ref 8.9–10.3)
Chloride: 88 mmol/L — ABNORMAL LOW (ref 98–111)
Creatinine, Ser: 0.78 mg/dL (ref 0.61–1.24)
GFR, Estimated: >60 mL/min (ref >60)
Glucose, Bld: 124 mg/dL — ABNORMAL HIGH (ref 70–99)
Potassium: 3.9 mmol/L (ref 3.5–5.1)
Sodium: 134 mmol/L — ABNORMAL LOW (ref 135–145)

## 2021-02-04 LAB — GLUCOSE, CAPILLARY
Glucose-Capillary: 130 mg/dL — ABNORMAL HIGH (ref 70–99)
Glucose-Capillary: 220 mg/dL — ABNORMAL HIGH (ref 70–99)
Glucose-Capillary: 331 mg/dL — ABNORMAL HIGH (ref 70–99)
Glucose-Capillary: 273 mg/dL — ABNORMAL HIGH (ref 70–99)

## 2021-02-04 MED ORDER — TAMSULOSIN HCL 0.4 MG PO CAPS
0.4000 mg | ORAL_CAPSULE | Freq: Every day | ORAL | 0 refills | Status: AC
  Filled 2021-02-04: qty 30, 30d supply, fill #0

## 2021-02-04 MED ORDER — POTASSIUM CHLORIDE CRYS ER 20 MEQ PO TBCR
20.0000 meq | EXTENDED_RELEASE_TABLET | Freq: Every day | ORAL | 0 refills | Status: AC
  Filled 2021-02-04: qty 10, 10d supply, fill #0

## 2021-02-04 MED ORDER — ADULT MULTIVITAMIN W/MINERALS CH: 1.0000 | ORAL_TABLET | Freq: Every day | ORAL | Status: AC

## 2021-02-04 MED ORDER — POLYETHYLENE GLYCOL 3350 17 GM/SCOOP PO POWD
17.0000 g | Freq: Every day | ORAL | 0 refills | Status: AC | PRN
  Filled 2021-02-04: qty 238, 14d supply, fill #0

## 2021-02-04 MED ORDER — THIAMINE HCL 100 MG PO TABS: 100.0000 mg | ORAL_TABLET | Freq: Every day | ORAL | Status: AC

## 2021-02-04 MED ORDER — NICOTINE POLACRILEX 2 MG MT GUM
2.0000 mg | CHEWING_GUM | OROMUCOSAL | 0 refills | Status: AC | PRN
  Filled 2021-02-04: qty 100, fill #0

## 2021-02-04 MED ORDER — FOLIC ACID 1 MG PO TABS: 1.0000 mg | ORAL_TABLET | Freq: Every day | ORAL | 0 refills | Status: AC

## 2021-02-04 MED ORDER — TORSEMIDE 20 MG PO TABS
60.0000 mg | ORAL_TABLET | Freq: Two times a day (BID) | ORAL | 0 refills | Status: AC
  Filled 2021-02-04: qty 180, 30d supply, fill #0

## 2021-02-04 MED ORDER — UMECLIDINIUM BROMIDE 62.5 MCG/ACT IN AEPB
1.0000 | INHALATION_SPRAY | Freq: Every day | RESPIRATORY_TRACT | 0 refills | Status: AC
  Filled 2021-02-04: qty 30, 30d supply, fill #0

## 2021-02-04 NOTE — Discharge Instructions (Addendum)
Dear Alex Martinez,   Thank you so much for allowing [place] to be part of your care!  You were admitted to [facility] for right sided heart failure (causing shortness of breath) and urinary retention (causing an inability to urinate).    POST-HOSPITAL & CARE INSTRUCTIONS Continue to take your medications Torsemide 60 mg twice daily  Flomax daily Incruse Elipta 1 puff daily Please schedule a follow up with PCP within a week. Speak with PCP about getting a 'BMP' to check your potassium and creatinine level.   DOCTOR'S APPOINTMENT & FOLLOW UP CARE INSTRUCTIONS  Future Appointments  Date Time Provider [place]  02/11/2021  2:30 PM Huns[place], Bonna Gains, MD [facility] None    RETURN PRECAUTIONS: Return for: -Increasing shortness of breath -Increasing fluid accumulation in extremities (swelling) -Inability to pee  Take care and be well!  [facility]  [address], [place] [address] [phone]

## 2021-02-04 NOTE — Progress Notes (Signed)
   02/04/21 1530  Clinical Encounter Type  Visited With Patient  Visit Type Initial  Referral From Nurse  Consult/Referral To Chaplain   Chaplain responded to the consult request for prayer. This chaplain offered a listening presence. The patient said he is concerned about being discharged and returning to the [facility] because he does not have money to purchase firewood. He stated he rents from his MD. This chaplain asked if he had addressed his concerns to his CSW. The patient said he was not aware he had CSW. I advised the patient I would make his concerns known to his attending nurse. Chaplain ended the visit with prayer. This chaplain informed RN. Janett Billow and the Financial controller stated she would contact his CSW. This note was prepared by Jeanine Luz, M.Div..  For questions please contact by phone [phone].

## 2021-02-04 NOTE — Progress Notes (Signed)
Mobility Specialist: Progress Note   02/04/21 1720  Mobility  Activity Ambulated in hall  Level of Assistance Contact guard assist, steadying assist  Assistive Device Front wheel walker  Distance Ambulated (ft) 380 ft  Mobility Ambulated with assistance in hallway  Mobility Response Tolerated well  Mobility performed by Mobility specialist  $Mobility charge 1 Mobility   Pt required contact guard during ambulation d/t unsteadiness from picking up on RW despite verbal cues to keep RW on the ground. Pt had no c/o during ambulation. Pt to BR after walk, void successful. Pt back to bed with call bell and phone at his side.   [facility] Aryana Wonnacott Mobility Specialist Mobility Specialist Phone: [phone]

## 2021-02-04 NOTE — TOC Transition Note (Signed)
Transition of Care [facility]) - CM/SW Discharge Note   Patient Details  Name: REGINO FOURNET MRN: [id number] Date of Birth: 15-Aug-1965  Transition of Care [facility]) CM/SW Contact:  Carles Collet, RN Phone Number: 02/04/2021, 4:55 PM   Clinical Narrative:    Patient will DC without [place] or DME needs.  MATCH meds through [place] provided to patient. No other TOC needs identified    Final next level of care: Home/Self Care Barriers to Discharge: No Barriers Identified   Patient Goals and CMS Choice Patient states their goals for this hospitalization and ongoing recovery are:: return home CMS Medicare.gov Compare Post Acute Care list provided to:: Patient Choice offered to / list presented to : Patient  Discharge Placement                       Discharge Plan and Services   Discharge Planning Services: CM Consult Post Acute Care Choice: Home Health, Durable Medical Equipment          DME Arranged: N/A           HH Agency: NA        Social Determinants of Health (SDOH) Interventions     Readmission Risk Interventions No flowsheet data found.

## 2021-02-04 NOTE — Plan of Care (Signed)
  Problem: Education: Goal: Knowledge of General Education information will improve Description: Including pain rating scale, medication(s)/side effects and non-pharmacologic comfort measures Outcome: Adequate for Discharge   Problem: Health Behavior/Discharge Planning: Goal: Ability to manage health-related needs will improve Outcome: Adequate for Discharge   Problem: Clinical Measurements: Goal: Ability to maintain clinical measurements within normal limits will improve Outcome: Adequate for Discharge Goal: Will remain free from infection Outcome: Adequate for Discharge Goal: Diagnostic test results will improve Outcome: Adequate for Discharge Goal: Respiratory complications will improve Outcome: Adequate for Discharge Goal: Cardiovascular complication will be avoided Outcome: Adequate for Discharge   Problem: Activity: Goal: Risk for activity intolerance will decrease Outcome: Adequate for Discharge   Problem: Nutrition: Goal: Adequate nutrition will be maintained Outcome: Adequate for Discharge   Problem: Coping: Goal: Level of anxiety will decrease Outcome: Adequate for Discharge   Problem: Elimination: Goal: Will not experience complications related to bowel motility Outcome: Adequate for Discharge Goal: Will not experience complications related to urinary retention Outcome: Adequate for Discharge   Problem: Pain Managment: Goal: General experience of comfort will improve Outcome: Adequate for Discharge   Problem: Safety: Goal: Ability to remain free from injury will improve Outcome: Adequate for Discharge   Problem: Skin Integrity: Goal: Risk for impaired skin integrity will decrease Outcome: Adequate for Discharge   Problem: Acute Rehab OT Goals (only OT should resolve) Goal: Pt. Will Perform Grooming Outcome: Adequate for Discharge Goal: Pt. Will Perform Lower Body Bathing Outcome: Adequate for Discharge Goal: Pt. Will Perform Lower Body  Dressing Outcome: Adequate for Discharge Goal: Pt. Will Transfer To Toilet Outcome: Adequate for Discharge Goal: Pt. Will Perform Toileting-Clothing Manipulation Outcome: Adequate for Discharge Goal: Pt/Caregiver Will Perform Home Exercise Program Outcome: Adequate for Discharge   Problem: Acute Rehab PT Goals(only PT should resolve) Goal: Pt Will Go Supine/Side To Sit Outcome: Adequate for Discharge Goal: Patient Will Transfer Sit To/From Stand Outcome: Adequate for Discharge Goal: Pt Will Transfer Bed To Chair/Chair To Bed Outcome: Adequate for Discharge Goal: Pt Will Ambulate Outcome: Adequate for Discharge

## 2021-02-04 NOTE — Progress Notes (Signed)
Inpatient Diabetes Program Recommendations  AACE/ADA: New Consensus Statement on Inpatient Glycemic Control (2015)  Target Ranges:  Prepandial:   less than 140 mg/dL      Peak postprandial:   less than 180 mg/dL (1-2 hours)      Critically ill patients:  140 - 180 mg/dL   Lab Results  Component Value Date   GLUCAP 331 (H) 02/04/2021   HGBA1C 6.7 (H) 01/10/2021    Review of Glycemic Control Results for Alex Martinez, Alex Martinez (MRN [id number]) as of 02/04/2021 12:01  Ref. Range 02/04/2021 08:11 02/04/2021 11:37  Glucose-Capillary Latest Ref Range: 70 - 99 mg/dL 220 (H) 331 (H)   Diabetes history: Type 2 DM Outpatient Diabetes medications: none Current orders for Inpatient glycemic control: Novolog 0-6 units TID  Inpatient Diabetes Program Recommendations:    Patient missed AM correction with a CBG of 220 mg/dL. Per [facility] document, "order parameters not met".  Consider adding Novolog 0-5 units QHS and Semglee 8 units QD.  Thanks, Bronson Curb, MSN, RNC-OB Diabetes Coordinator [phone] (8a-5p)

## 2021-02-04 NOTE — Progress Notes (Signed)
Patient ID: Alex Martinez, male   DOB: [birth year], 55 y.o.   MRN: [id number]   Pt noted to have passed his voiding trial.  Continue tamsulosin upon discharge.  I will arrange outpatient f/u in a few weeks to reassess.

## 2021-02-04 NOTE — Progress Notes (Signed)
S: Mr Cappiello seen early this AM. He was voiding well overnight and denied any pain. He was bathing on room air with normal O2 saturations. He felt improved and like he could possibly go home.  O: Vitals:   02/04/21 1135 02/04/21 1646  BP: 118/84 117/88  Pulse: 96 (!) 107  Resp: 18 19  Temp: 98.2 F (36.8 C) 97.8 F (36.6 C)  SpO2: 98% 100%   General: A&O, NAD HEENT: No sign of trauma, EOM grossly intact Respiratory: normal WOB, on room air GI: non-distended  Extremities: trace ankle edema, wrinkling of skin on legs Neuro: Normal gait, moves all four extremities appropriately Skin: no lesions/rashes visualized Psych: Appropriate mood and affect    A/P: Grade 3 pulmonary HTN/RV dysfunction - BID torsemide 65m and daily 20 mEq Kcl - will need close PCP and outpatient pulmonology follow up  Acute hypoxic RF- resolved, 2/2 above, continue diuresis  COPD- new inhalers started, TOC for meds at discharge  Urinary retention, resolved- continue Flomax at discharge, outpatient follow up with urology

## 2021-02-04 NOTE — Progress Notes (Signed)
Physical Therapy Treatment Patient Details Name: Alex Martinez MRN: [id number] DOB: Mar 18, 1966 Today's Date: 02/04/2021   History of Present Illness Pt is a 55 y.o. male admitted on 01/29/21 with SOB and leg/scrotal edema secondary to acute COPD and CHF exacerbation. He was seen for a similar presentation a few days ago and left without completing treatment. PMH includes DM, diastolic, ETOH and tobacco abuse, pulmonary HTN.    PT Comments    Pt is progressing well with mobility. Treatment today focused on ambulation and increasing activity tolerance. Pt ambulated 980 ft in hallway with supervision for safety. Pt started ambulation with RW but was able to leave it halfway through with no LOB or decrease in gait speed. Pt easily distracted and required frequent cues to redirect to task and use DME correctly. Pt would benefit from further acute PT services to continue to progress functional mobility and increase activity tolerance before returning home.     Recommendations for follow up therapy are one component of a multi-disciplinary discharge planning process, led by the attending physician.  Recommendations may be updated based on patient status, additional functional criteria and insurance authorization.  Follow Up Recommendations  No PT follow up     Assistance Recommended at Discharge PRN  Equipment Recommendations  None recommended by PT    Recommendations for Other Services       Precautions / Restrictions Precautions Precautions: Fall Precaution Comments: watch O2 Restrictions Weight Bearing Restrictions: No     Mobility  Bed Mobility Overal bed mobility: Independent Bed Mobility: Supine to Sit     Supine to sit: Independent          Transfers   Equipment used: None Transfers: Sit to/from Stand Sit to Stand: Independent           General transfer comment: Pt stood from EOB to stand and use the bathroom independently     Ambulation/Gait Ambulation/Gait assistance: Supervision Gait Distance (Feet): 980 Feet Assistive device: Rolling walker (2 wheels);None Gait Pattern/deviations: Step-through pattern;Wide base of support Gait velocity: decreased   General Gait Details: Pt started ambulating with RW but required frequent cues to keep walker on the ground; halfway through we left RW and patient was able to maintain balance and gait speed. Supervision for safety and line management.   Stairs             Wheelchair Mobility    Modified Rankin (Stroke Patients Only)       Balance Overall balance assessment: No apparent balance deficits (not formally assessed) Sitting-balance support: Feet supported;No upper extremity supported Sitting balance-Leahy Scale: Good Sitting balance - Comments: able to sit on eob without assistance   Standing balance support: During functional activity;No upper extremity supported Standing balance-Leahy Scale: Good Standing balance comment: Pt able to maintain balance statically and while ambulating with no UE support             High level balance activites: Sudden stops;Turns;Side stepping;Backward walking High Level Balance Comments: Balance not formally assessed but pt able to perform higer level balance activities above            Cognition Arousal/Alertness: Awake/alert Behavior During Therapy: Impulsive Overall Cognitive Status: Impaired/Different from baseline Area of Impairment: Safety/judgement;Following commands;Problem solving;Attention                   Current Attention Level: Selective Memory: Decreased short-term memory Following Commands: Follows one step commands with increased time Safety/Judgement: Decreased awareness of safety;Decreased awareness of deficits  Problem Solving: Slow processing General Comments: difficulty staying on task, frequent cues to redirect; easily distracted; only focuses on one thing at a time         Exercises      General Comments General comments (skin integrity, edema, etc.): SpO2 89 on room air before, during, and after ambulation      Pertinent Vitals/Pain Pain Assessment: Faces Faces Pain Scale: Hurts a little bit Pain Location: scrotum, groin area with mobility Pain Descriptors / Indicators: Discomfort Pain Intervention(s): Repositioned    Home Living                          Prior Function            PT Goals (current goals can now be found in the care plan section) Acute Rehab PT Goals Patient Stated Goal: decrease pain and swelling PT Goal Formulation: With patient Time For Goal Achievement: 02/12/21 Potential to Achieve Goals: Good Progress towards PT goals: Progressing toward goals    Frequency    Min 3X/week      PT Plan Discharge plan needs to be updated    Co-evaluation              AM-PAC PT "6 Clicks" Mobility   Outcome Measure  Help needed turning from your back to your side while in a flat bed without using bedrails?: None Help needed moving from lying on your back to sitting on the side of a flat bed without using bedrails?: None Help needed moving to and from a bed to a chair (including a wheelchair)?: None Help needed standing up from a chair using your arms (e.g., wheelchair or bedside chair)?: None Help needed to walk in hospital room?: A Little Help needed climbing 3-5 steps with a railing? : A Little 6 Click Score: 22    End of Session Equipment Utilized During Treatment: Gait belt Activity Tolerance: Patient tolerated treatment well Patient left: in chair;with call bell/phone within reach;Other (comment) (with lunch tray) Nurse Communication: Mobility status PT Visit Diagnosis: Unsteadiness on feet (R26.81);Other abnormalities of gait and mobility (R26.89);Muscle weakness (generalized) (M62.81);Difficulty in walking, not elsewhere classified (R26.2)     Time: 3007-6226 PT Time Calculation (min)  (ACUTE ONLY): 24 min  Charges:  $Gait Training: 8-22 mins $Therapeutic Exercise: 8-22 mins                     Brandon Melnick, SPT   Brandon Melnick 02/04/2021, 11:57 AM

## 2021-02-04 NOTE — Progress Notes (Signed)
FPTS Interim Progress Note  S:Patient sleeping soundly in right lateral decubitus position. [place] out of nares and on forehead. SpO2 75%. Patient awoke easily. Placed  in nose and turned to 2L O2; SpO2 quickly rose to 90%. No complaints from patient. Discussed with RN, no concerns at this time. Currently awaiting measurements from bladder scan to be recorded.   O: BP 126/86 (BP Location: Left Arm)   Pulse 100   Temp 98.6 F (37 C) (Oral)   Resp 20   Wt 98.1 kg   SpO2 91%   BMI 31.03 kg/m     A/P: Right ventricular dysfunction   Grade 3 pulmonary hypertension Current 24 hour I/O's net -1,505 mL and -30,855 mL since admission. Continue plan per daily progress note.    Acute on chronic hypercarbic respiratory failure secondary to COPD and CHF SpO2 > 90% on 2L O2.    Urinary retention likely due to BPH Passed voiding trial today. Continue bladder scans q3 hours for full 24 hours. If less than 300 mL patient to continue tamsulosin.  If greater than 300 mL, we will contact urology     Ezequiel Essex, MD 02/04/2021, 2:10 AM PGY-2, [place] Medicine Service pager [phone]

## 2021-02-04 NOTE — Progress Notes (Signed)
Family Medicine Teaching Service Daily Progress Note Intern Pager: [phone]  Patient name: Alex Martinez Medical record number: [id number] Date of birth: 1965-06-18 Age: 55 y.o. Gender: male  Primary Care Provider: Leonard Downing, MD Consultants: PCCM Code Status: Full  Pt Overview and Major Events to Date:  01/24/21-Admitted  Assessment and Plan:  Alex Martinez is a 55 year old male who presented with dyspnea and urinary retention.  PMH significant for right-sided heart failure, COPD, type 2 diabetes, tobacco use disorder  Right ventricular dysfunction   Grade 3 pulmonary hypertension Uop: 2.7L, wt stable from yesterday 98.1 kg. Cr .78, kidney function tolerating diuresis well. -Daily weights -Continue PO toresmide 60 mg BID -Strict I/O's -Daily BMP   Acute on chronic hypercarbic respiratory failure secondary to COPD and CHF Patient currently on room air this AM.  Respiratory status improved with a normal work of breathing.  -DuoNebs -Incruse Elipta 1 puff daily -CCM   Urinary retention likely due to BPH Patient passed voiding trial yesterday Bladder scan 64 mL and 127 ml.  No longer retaining, UOP 2.77 L. -Continue Flomax 0.4 daily  DM2 4 units short acting over past 24 hours. CBG range 120-220's -Very sensitive SSI  L1 Fracture Found on lumbar x-ray, follow-up outpatient  Polysubstance abuse Stable, patient with history of alcohol and tobacco abuse.  No longer on CIWA's  FEN/GI: Heart Healthy Diet PPx: Lovenox Dispo:Home with home health  pending clinical improvement .    Subjective:  Patient feeling well today, with no complaints. He is breathing better and was up bathing himself this morning  Objective: Temp:  [97.8 F (36.6 C)-99 F (37.2 C)] 98.4 F (36.9 C) (11/02 0350) Pulse Rate:  [89-100] 100 (11/02 0350) Resp:  [18-20] 18 (11/02 0350) BP: (106-145)/(85-95) 145/85 (11/02 0350) SpO2:  [90 %-92 %] 91 % (11/02 0350) Weight:  [98.1  kg] 98.1 kg (11/02 0350) Physical Exam: General: Well appearing, polite, white male Cardiovascular: RRR, [place] Respiratory: CTABL, no wheezes or rhonchi Abdomen: Soft, non-tender, non-distended Extremities: Pulses intact in all extremities, cap refill < 2 seconds  Laboratory: Recent Labs  Lab 01/31/21 0058 02/01/21 0517 02/02/21 0115  WBC 7.3 6.4 6.4  HGB 13.9 14.2 16.0  HCT 46.1 46.5 51.4  PLT 244 244 263   Recent Labs  Lab 02/02/21 0115 02/03/21 0207 02/04/21 0227  NA 136 136 134*  K 4.1 4.0 3.9  CL 92* 89* 88*  CO2 40* 36* 38*  BUN _0 CREATININE 0.69 0.69 0.78  CALCIUM 8.9 9.1 9.0  GLUCOSE 101* 119* 124*      Imaging/Diagnostic Tests:   Holley Bouche, MD 02/04/2021, 6:21 AM PGY-1, [place] Intern pager: [phone], text pages welcome

## 2021-02-05 NOTE — Discharge Summary (Addendum)
[facility] Discharge Summary  Patient name: Alex Martinez Medical record number: [id number] Date of birth: Sep 08, 1965 Age: 55 y.o. Gender: male Date of Admission: 01/27/2021  Date of Discharge: 02/04/21 Admitting Physician: Candee Furbish, MD  Primary Care Provider: Leonard Downing, MD Consultants: PCCM  Indication for Hospitalization: Acute hypoxic respiratory failure  Discharge Diagnoses/Problem List:  Right ventricular dysfunction   Grade 3 pulmonary hypertension Acute on chronic hypercarbic respiratory failure secondary to COPD and CHF Urinary complaints, possible BPH T2DM Tobacco use disorder Alcohol use disorder  Disposition: Home  Discharge Condition: Improved  Discharge Exam:  Temp:  [97.8 F (36.6 C)-99 F (37.2 C)] 98.4 F (36.9 C) (11/02 0350) Pulse Rate:  [89-100] 100 (11/02 0350) Resp:  [18-20] 18 (11/02 0350) BP: (106-145)/(85-95) 145/85 (11/02 0350) SpO2:  [90 %-92 %] 91 % (11/02 0350) Weight:  [98.1 kg] 98.1 kg (11/02 0350) Physical Exam: General: Well appearing, polite, white male Cardiovascular: RRR, [place] Respiratory: CTABL, no wheezes or rhonchi, normal work of breathing Abdomen: Soft, non-tender, non-distended Extremities: Trace ankle edema, wrinkling of skin on legs, pulses intact in all extremities, cap refill < 2 seconds  Brief Hospital Course:  Alex Martinez is a 55 y.o. male presenting with SOB and urinary retention. PMH is significant for COPD, Right sided HF, T2DM, alcohol use disorder, tobacco use disorder.    Acute on chronic hypoxic respiratory failure  COPD  Right sided HF  pulmonary HTN Patient presented with acute on chronic dyspnea and had sats in the mid 80's per ED provider, requiring 5L [place]. On exam he had prominent wheezes with a prolonged expiratory phase as well as 2-3+ pitting edema to the proximal tibia, hips and scrotal edema. Workup notable for a BNP of 686 and an unremarkable CXR,  no leukocytosis and afebrile.  Pt refused to wear BiPAP. Pt has hx of alcohol use disorder, was agitated in the ED and given ativan 1 mg. ABG showed pH 7.26, pCO2 106.9, pO2 148, bicarb 48.6. Pt became obtunded, PCCM consulted, was put on BiPAP and transferred to ICU. Pt remained in ICU for 1 night. U/S of LE r/o DVT. Pt was treated with oxygen via , acetazolamide 558m BID for 2 days, duonebs 3 mL BID, incruse ellipta, prednisone 40 mg for 3 days and IV lasix 60 mg BID. Pt was transitioned to PO Torsemide 60 mg BID. At discharge, pt had a net -32.7 L output for the admission. Patient discharged home on room air with minimal ankle pitting edema and an ambulatory pulse ox in the 89%, with clear lung exam.    Urinary Retention Urology was consulted on patient due to urinary retention of 6062mon bladder scan.  Urology was able to place coud catheter. They recommended alpha-blocker therapy with tamsulosin prior to removal of catheter. Patient passed voiding trial and had resolution of urinary retention. Pt will need to follow up with urology out patient.   Encephalopathy, multifactorial Encephalopathy likely due to hypercarbia.  Patient also has alcohol use disorder with history of withdrawal which could have play into AMS. Pt's mentation improved greatly throughout hospitalization.  Alcohol withdrawal Pt very cautiously received ativan prn for withdrawal symptoms, was only given on day one and again on day 3.  Type 2 diabetes Pt treated with very sensitive SSI  and had CBG monitoring 4 times daily, before meals and at bedtime. Patient blood sugars well controlled throughout visit.   L1 fracture Evidence of a fracture involving L1 vertebral  body was  Incidentally found on CTA from 01/30/21. X-ray following CTA showed age-indeterminate L1 compression deformity, unchanged since 01/09/2021 but new since 04/10/2018.    Pulmonary nodules Pt has hx of pulmonary nodules on CTA chest from 2020 requiring  out patient follow up  Issues for follow up: Ensure follow up with pulmonary out patient  Ensure follow up with urology out patient Follow up for pulmonary nodules Follow up on L1 compression fracture   Significant Procedures: None  Significant Labs and Imaging:  Recent Labs  Lab 01/31/21 0058 02/01/21 0517 02/02/21 0115  WBC 7.3 6.4 6.4  HGB 13.9 14.2 16.0  HCT 46.1 46.5 51.4  PLT 244 244 263   Recent Labs  Lab 01/30/21 0235 01/31/21 0058 02/01/21 0517 02/02/21 0115 02/03/21 0207 02/04/21 0227  NA 133* 135 135 136 136 134*  K 3.7 3.9 4.1 4.1 4.0 3.9  CL 86* 89* 92* 92* 89* 88*  CO2 43* 41* 38* 40* 36* 38*  GLUCOSE 146* 100* 118* 101* 119* 124*  BUN _0 CREATININE 0.73 0.68 0.59* 0.69 0.69 0.78  CALCIUM 8.7* 9.1 9.1 8.9 9.1 9.0  MG 2.1 2.2 2.4 2.2 1.9  --       Results/Tests Pending at Time of Discharge:   Discharge Medications:  Allergies as of 02/04/2021       Reactions   [employer], Other (See Comments)   Shaking, starts sweating really badly   Yellow Jacket Venom [bee Venom] Swelling        Medication List     STOP taking these medications    albuterol 108 (90 Base) MCG/ACT inhaler Commonly known as: VENTOLIN HFA   furosemide 80 MG tablet Commonly known as: LASIX       TAKE these medications    blood glucose meter kit and supplies Dispense based on patient and insurance preference. Use up to four times daily as directed. (FOR ICD-10 E10.9, E11.9).   folic acid 1 MG tablet Commonly known as: FOLVITE Take 1 tablet (1 mg total) by mouth daily.   multivitamin with minerals Tabs tablet Take 1 tablet by mouth daily.   NIACIN PO Take 1 tablet by mouth daily as needed (for anxiousness).   nicotine polacrilex 2 MG gum Commonly known as: NICORETTE Take 1 each (2 mg total) by mouth as needed for smoking cessation.   polyethylene glycol powder 17 GM/SCOOP powder Commonly known as: GLYCOLAX/MIRALAX Take 17 g by mouth  daily as needed for mild constipation.   potassium chloride SA 20 MEQ tablet Commonly known as: KLOR-CON Take 1 tablet (20 mEq total) by mouth daily for 10 days.   tamsulosin 0.4 MG Caps capsule Commonly known as: FLOMAX Take 1 capsule (0.4 mg total) by mouth daily.   thiamine 100 MG tablet Take 1 tablet (100 mg total) by mouth daily.   torsemide 20 MG tablet Commonly known as: DEMADEX Take 3 tablets (60 mg total) by mouth 2 (two) times daily.   umeclidinium bromide 62.5 MCG/ACT Aepb Commonly known as: INCRUSE ELLIPTA Inhale 1 puff into the lungs daily.        Discharge Instructions: Please refer to Patient Instructions section of EMR for full details.  Patient was counseled important signs and symptoms that should prompt return to medical care, changes in medications, dietary instructions, activity restrictions, and follow up appointments.   Follow-Up Appointments:  Follow-up Information     Hunsucker, Bonna Gains, MD Follow up on 02/11/2021.  Specialty: Pulmonary Disease Why: Appt at 2:30 PM.  Please arrive at 2:15 for check in. Contact information: [address] [place] [address] [phone]                 Holley Bouche, MD 02/05/2021, 2:37 AM PGY-1, [place]

## 2021-02-11 ENCOUNTER — Other Ambulatory Visit: Payer: Self-pay

## 2021-02-11 ENCOUNTER — Ambulatory Visit (INDEPENDENT_AMBULATORY_CARE_PROVIDER_SITE_OTHER): Payer: Self-pay | Admitting: Pulmonary Disease

## 2021-02-11 ENCOUNTER — Encounter: Payer: Self-pay | Admitting: Pulmonary Disease

## 2021-02-11 VITALS — BP 138/80 | HR 100 | Temp 98.2°F | Ht 71.0 in | Wt 212.6 lb

## 2021-02-11 DIAGNOSIS — I272 Pulmonary hypertension, unspecified: Secondary | ICD-10-CM

## 2021-02-11 LAB — COMPREHENSIVE METABOLIC PANEL
ALT: 25 U/L (ref 0–53)
AST: 23 U/L (ref 0–37)
Albumin: 3.9 g/dL (ref 3.5–5.2)
Alkaline Phosphatase: 87 U/L (ref 39–117)
BUN: 17 mg/dL (ref 6–23)
CO2: 42 mEq/L — ABNORMAL HIGH (ref 19–32)
Calcium: 9.6 mg/dL (ref 8.4–10.5)
Chloride: 88 mEq/L — ABNORMAL LOW (ref 96–112)
Creatinine, Ser: 0.97 mg/dL (ref 0.40–1.50)
GFR: 87.73 mL/min (ref >60.00)
Glucose, Bld: 164 mg/dL — ABNORMAL HIGH (ref 70–99)
Potassium: 3.8 mEq/L (ref 3.5–5.1)
Sodium: 134 mEq/L — ABNORMAL LOW (ref 135–145)
Total Bilirubin: 0.5 mg/dL (ref 0.2–1.2)
Total Protein: 7.5 g/dL (ref 6.0–8.3)

## 2021-02-11 LAB — HEMOGLOBIN A1C: Hgb A1c MFr Bld: 8.2 % — ABNORMAL HIGH (ref 4.6–6.5)

## 2021-02-11 LAB — BRAIN NATRIURETIC PEPTIDE: Pro B Natriuretic peptide (BNP): 83 pg/mL (ref 0.0–100.0)

## 2021-02-11 NOTE — Patient Instructions (Signed)
Nice to meet you  We will repeat blood work today  I ordered a repeat heart ultrasound  Return to clinic in 2 months or sooner as needed

## 2021-02-11 NOTE — Progress Notes (Signed)
_0  ID: Alex Martinez, male    DOB: July 30, 1965, 55 y.o.   MRN: [id number]  Chief Complaint  Patient presents with   Consult    Pt states pulmonary hypertension     Referring provider: Leonard Downing, *  HPI:   55 y.o. man whom we are seeing in follow-up from recent hospitalization x2 for acute on chronic hypercarbic respiratory failure, hypoxemic respiratory failure, suspected pulmonary hypertension based on echocardiogram.  H&P x2 reviewed.  Discharge summary x2 reviewed.  Most recent pulmonary consult note reviewed.  Overall, patient is about the story.  Paranoid at times.  He went to the hospital early 01/2021 with scrotal and lower extremity swelling.  He was in the ED waiting bed placement after being admitted.  He was found to be hypoxemic needing 3 L of cannula.  Given IV Lasix.  He states that the nurse in the ED convinced him to go home.  Said that they were just treating a symptom and not the underlying problem.  This seems a bit paranoid to me.  I cannot imagine this is actually the case.  Regardless, he left AMA from the emergency room.  He was placed on Lasix.  He returned 2 weeks later with no improvement in scrotal or lower extremity swelling.  He required BiPAP this time for hypercarbia after some Ativan.  Pulmonary was consulted.  Foley catheter was placed.  He was aggressively diuresed.  Looks like weight down 14 kg from early October to discharge 02/04/2021.  Chest x-ray during admission demonstrated vascular congestion, pulmonary edema small bilateral pleural effusions on my interpretation.  CTA PE protocol during admission showed bilateral pleural effusions, no PE.  Echocardiogram on my review revealed volume pressure overload on the RV, left atrial dilation, enlarged RV, severely dilated RA, elevated pulmonary pressures.  He denies any dyspnea.  Not really short of breath.  States swelling has improved.  On torsemide 60 mg daily.  No labs checked since last  visit that I can tell.  He states has not seen his PCP nor have labs been checked since discharge.  Discussed at length further work-up for pulmonary hypertension.  Require polysomnography, VQ scan, ultimately right heart catheterization for diagnosis.  Discussed at length given multiple issues contributing, medications can very well make his symptoms worse.  Overall, counseled patient that think that medications to treat this would not be in his best interest at this time.  See below assessment plan for further discussion.  Questionaires / Pulmonary Flowsheets:   ACT:  No flowsheet data found.  MMRC: No flowsheet data found.  Epworth:  No flowsheet data found.  Tests:   FENO:  No results found for: NITRICOXIDE  PFT: No flowsheet data found.  WALK:  No flowsheet data found.  Imaging: Personally reviewed and as per EMR discussion this note. DG Chest 2 View  Result Date: 01/27/2021 CLINICAL DATA:  Shortness of breath. EXAM: CHEST - 2 VIEW COMPARISON:  January 09, 2021 FINDINGS: The heart size and mediastinal contours are within normal limits. Both lungs are clear. The visualized skeletal structures are unremarkable. IMPRESSION: No active cardiopulmonary disease. Electronically Signed   By: Virgina Norfolk M.D.   On: 01/27/2021 22:25   DG Lumbar Spine 2-3 Views  Result Date: 01/30/2021 CLINICAL DATA:  Back pain, urinary retention EXAM: LUMBAR SPINE - 2-3 VIEW COMPARISON:  01/30/2021, 01/09/2021, 04/10/2018 FINDINGS: Frontal and lateral views of the lumbar spine are obtained. There is an anterior wedge compression fracture at L1,  with the proximally 75% loss of height. There is bowing of the posterior aspect of the L1 vertebral body without definite retropulsion. This is unchanged since chest x-ray 01/09/2021, but new since prior chest CT 04/10/2018. No other acute lumbar spine fractures. Mild diffuse spondylosis and facet hypertrophy greatest at the lumbosacral junction. Sacroiliac  joints are unremarkable. IMPRESSION: 1. Age indeterminate L1 compression deformity, with 75% loss of height anteriorly. This fracture is unchanged since 01/09/2021, but new since 04/10/2018. 2. No other acute bony abnormalities. 3. Multilevel spondylosis and facet hypertrophy greatest at the lumbosacral junction. Electronically Signed   By: Randa Ngo M.D.   On: 01/30/2021 20:02   CT Angio Chest Pulmonary Embolism (PE) W or WO Contrast  Result Date: 01/30/2021 CLINICAL DATA:  Shortness of breath. Pulmonary embolism suspected, high probability. History of diabetes, hypertension, pulmonary arterial hypertension, congestive heart failure and COPD. EXAM: CT ANGIOGRAPHY CHEST WITH CONTRAST TECHNIQUE: Multidetector CT imaging of the chest was performed using the standard protocol during bolus administration of intravenous contrast. Multiplanar CT image reconstructions and MIPs were obtained to evaluate the vascular anatomy. CONTRAST:  30m OMNIPAQUE IOHEXOL 350 MG/ML SOLN COMPARISON:  Radiographs 01/27/2021 and 01/09/2021.  CT 04/10/2018. FINDINGS: Despite efforts by the technologist and patient, mild motion artifact is present on today's exam and could not be eliminated. This reduces exam sensitivity and specificity. Cardiovascular: The pulmonary arteries are suboptimally opacified with contrast, and pulmonary arterial assessment is limited by breathing artifact. No definite pulmonary emboli are seen. There is atherosclerosis of the aorta, great vessels and coronary arteries. There is mild central enlargement of the pulmonary arteries. The heart size is normal. There is no pericardial effusion. Mediastinum/Nodes: There are no enlarged mediastinal, hilar or axillary lymph nodes.The previously identified mildly enlarged hilar lymph nodes appear improved. The thyroid gland, trachea and esophagus demonstrate no significant findings. Lungs/Pleura: Trace bilateral pleural effusions with new subsegmental atelectasis in  both upper and lower lobes. There is underlying mild centrilobular and paraseptal emphysema. No consolidation or suspicious pulmonary nodule. No pneumothorax. Upper abdomen: Hepatic low density consistent with steatosis. There is also mild contour irregularity of the liver, suspicious for early cirrhosis. No acute findings are evident. Musculoskeletal/Chest wall: No chest wall mass or definite acute osseous findings are seen within the chest. There is suspicion of a burst fracture involving the L1 vertebral body, incompletely visualized by this study, but appearing new compared with the prior chest CT from 2020. This is associated with some osseous retropulsion. Generalized flank edema noted. Review of the MIP images confirms the above findings. IMPRESSION: 1. No definite evidence of acute pulmonary embolism or other acute vascular findings in the chest. Opacification of the pulmonary arteries is suboptimal. If there is persistent high concern of thromboembolic disease, consider further evaluation with lower extremity venous Doppler ultrasound. 2. Suspected interval burst fracture at L1 compared with chest CT 04/10/2018. This is incompletely visualized by this examination, although does not show definite acute features. Correlation with lumbar spine radiographs recommended. 3. Dependent atelectasis in both lungs. 4. Hepatic steatosis and possible early changes of cirrhosis. 5.  Aortic Atherosclerosis (ICD10-I70.0). Electronically Signed   By: WRichardean SaleM.D.   On: 01/30/2021 13:15   VAS [place] EXTREMITY VENOUS (DVT)  Result Date: 01/29/2021  Lower Venous DVT Study Patient Name:  WBRETTON TANDY Date of Exam:   01/28/2021 Medical Rec #: [id number]          Accession #:    [id number] of Birth:  04-23-1965            Patient Gender: M Patient Age:   40 years Exam Location:  [facility] Procedure:      VAS [place] LOWER EXTREMITY VENOUS (DVT) Referring Phys: Ina Homes  --------------------------------------------------------------------------------  Indications: Edema.  Limitations: Body habitus, poor ultrasound/tissue interface and patient inability to cooperate. Comparison Study: No previous exams Performing Technologist: Jody Hill RVT, RDMS  Examination Guidelines: A complete evaluation includes B-mode imaging, spectral Doppler, color Doppler, and power Doppler as needed of all accessible portions of each vessel. Bilateral testing is considered an integral part of a complete examination. Limited examinations for reoccurring indications may be performed as noted. The reflux portion of the exam is performed with the patient in reverse Trendelenburg.  +---------+---------------+---------+-----------+----------+------------------+ RIGHT    CompressibilityPhasicitySpontaneityPropertiesThrombus Aging     +---------+---------------+---------+-----------+----------+------------------+ CFV      Full           Yes      Yes                                     +---------+---------------+---------+-----------+----------+------------------+ SFJ      Full                                                            +---------+---------------+---------+-----------+----------+------------------+ FV Prox  Full           Yes      Yes                                     +---------+---------------+---------+-----------+----------+------------------+ FV Mid   Full           Yes      Yes                                     +---------+---------------+---------+-----------+----------+------------------+ FV DistalFull           Yes      Yes                                     +---------+---------------+---------+-----------+----------+------------------+ PFV      Full                                                            +---------+---------------+---------+-----------+----------+------------------+ POP      Full           Yes      Yes                                      +---------+---------------+---------+-----------+----------+------------------+ PTV  Not seen on this                                                         exam               +---------+---------------+---------+-----------+----------+------------------+ PERO                                                  Not well                                                                 visualized         +---------+---------------+---------+-----------+----------+------------------+   Right Technical Findings: Not visualized segments include PTVs.  +---------+---------------+---------+-----------+----------+------------------+ LEFT     CompressibilityPhasicitySpontaneityPropertiesThrombus Aging     +---------+---------------+---------+-----------+----------+------------------+ CFV      Full           Yes      Yes                                     +---------+---------------+---------+-----------+----------+------------------+ SFJ      Full                                                            +---------+---------------+---------+-----------+----------+------------------+ FV Prox  Full           Yes      Yes                                     +---------+---------------+---------+-----------+----------+------------------+ FV Mid   Full           Yes      Yes                                     +---------+---------------+---------+-----------+----------+------------------+ FV DistalFull           Yes      Yes                                     +---------+---------------+---------+-----------+----------+------------------+ PFV      Full                                                            +---------+---------------+---------+-----------+----------+------------------+ POP  Full           Yes      Yes                                      +---------+---------------+---------+-----------+----------+------------------+ PTV                                                   Not seen on this                                                         exam               +---------+---------------+---------+-----------+----------+------------------+ PERO                                                  Not well                                                                 visualized         +---------+---------------+---------+-----------+----------+------------------+   Left Technical Findings: Not visualized segments include PTVs.   Summary: BILATERAL: - No evidence of deep vein thrombosis seen in the lower extremities, bilaterally. - No evidence of superficial venous thrombosis in the lower extremities, bilaterally. -No evidence of popliteal cyst, bilaterally. RIGHT: - Portions of this examination were limited- see technologist comments above.  LEFT: - Portions of this examination were limited- see technologist comments above.  *See table(s) above for measurements and observations. Electronically signed by Orlie Pollen on 01/29/2021 at 6:46:58 PM.    Final     Lab Results: Personally reviewed CBC    Component Value Date/Time   WBC 6.4 02/02/2021 0115   RBC 5.36 02/02/2021 0115   HGB 16.0 02/02/2021 0115   HCT 51.4 02/02/2021 0115   PLT 263 02/02/2021 0115   MCV 95.9 02/02/2021 0115   MCH 29.9 02/02/2021 0115   MCHC 31.1 02/02/2021 0115   RDW 15.3 02/02/2021 0115   LYMPHSABS 1.0 01/27/2021 1701   MONOABS 0.5 01/27/2021 1701   EOSABS 0.5 01/27/2021 1701   BASOSABS 0.0 01/27/2021 1701    BMET    Component Value Date/Time   NA 134 (L) 02/04/2021 0227   K 3.9 02/04/2021 0227   CL 88 (L) 02/04/2021 0227   CO2 38 (H) 02/04/2021 0227   GLUCOSE 124 (H) 02/04/2021 0227   BUN 18 02/04/2021 0227   CREATININE 0.78 02/04/2021 0227   CALCIUM 9.0 02/04/2021 0227   GFRNONAA >60 02/04/2021 0227   GFRAA >60  04/15/2018 0755    BNP    Component Value Date/Time   BNP 685.6 (H) 01/27/2021 1701    ProBNP No results found for: PROBNP  Specialty Problems  Pulmonary Problems   CAP (community acquired pneumonia)   Acute on chronic respiratory failure with hypoxia ([place])   Pulmonary nodules   Reactive airway disease   Acute respiratory failure with hypoxia (HCC)   Acute exacerbation of chronic obstructive pulmonary disease (COPD) (HCC)   COPD (chronic obstructive pulmonary disease) ([place])    CTA chest (04/2018): bilateral apical bullae with pulmonary outflow tract dilation c/w pulmonary hypertension      COPD exacerbation (HCC)   Productive cough   Respiratory failure, unspecified with hypoxia ([place])   Hypoxia    Allergies  Allergen Reactions   Fire Ant Hives and Other (See Comments)    Shaking, starts sweating really badly   Yellow Jacket Venom [Bee Venom] Swelling    Immunization History  Administered Date(s) Administered   PPD Test 08/07/2013    Past Medical History:  Diagnosis Date   Acute exacerbation of chronic obstructive pulmonary disease (COPD) ([place]) 01/10/2021   Alcohol abuse    Atherosclerosis of aorta ([place]) 01/10/2021   Congestive heart failure (CHF) (HCC)    Diabetes mellitus without complication (HCC)    Lobar pneumonia ([place])    Lymphadenopathy 04/11/2018   Renal disorder    S/P alcohol detoxification 12/05/2013   Sepsis due to pneumonia ([place]) 04/17/2018    Tobacco History: Social History   Tobacco Use  Smoking Status Every Day   Packs/day: 2.00   Years: 37.00   Pack years: 74.00   Types: Cigarettes  Smokeless Tobacco Never   Ready to quit: Not Answered Counseling given: Not Answered     Outpatient Encounter Medications as of 02/11/2021  Medication Sig   potassium chloride SA (KLOR-CON) 20 MEQ tablet Take 1 tablet (20 mEq total) by mouth daily for 10 days.   tamsulosin (FLOMAX) 0.4 MG CAPS capsule Take 1 capsule (0.4 mg total) by mouth daily.    torsemide (DEMADEX) 20 MG tablet Take 3 tablets (60 mg total) by mouth 2 (two) times daily.   umeclidinium bromide (INCRUSE ELLIPTA) 62.5 MCG/ACT AEPB Inhale 1 puff into the lungs daily.   blood glucose meter kit and supplies Dispense based on patient and insurance preference. Use up to four times daily as directed. (FOR ICD-10 E10.9, E11.9). (Patient not taking: Reported on 05/06/1153)   folic acid (FOLVITE) 1 MG tablet Take 1 tablet (1 mg total) by mouth daily. (Patient not taking: Reported on 02/11/2021)   Multiple Vitamin (MULTIVITAMIN WITH MINERALS) TABS tablet Take 1 tablet by mouth daily. (Patient not taking: Reported on 02/11/2021)   NIACIN PO Take 1 tablet by mouth daily as needed (for anxiousness). (Patient not taking: Reported on 02/11/2021)   nicotine polacrilex (NICORETTE) 2 MG gum Take 1 each (2 mg total) by mouth as needed for smoking cessation. (Patient not taking: Reported on 02/11/2021)   polyethylene glycol powder (GLYCOLAX/MIRALAX) 17 GM/SCOOP powder Take 17 g by mouth daily as needed for mild constipation. (Patient not taking: Reported on 02/11/2021)   thiamine 100 MG tablet Take 1 tablet (100 mg total) by mouth daily. (Patient not taking: Reported on 02/11/2021)   No facility-administered encounter medications on file as of 02/11/2021.     Review of Systems  Review of Systems  No chest pain with exertion.  No orthopnea or PND.  Comprehensive review of systems otherwise negative. Physical Exam  BP 138/80 (BP Location: Left Arm, Patient Position: Sitting, Cuff Size: Normal)   Pulse 100   Temp 98.2 F (36.8 C) (Oral)   Ht _0  (1.803 m)  Wt 212 lb 9.6 oz (96.4 kg)   SpO2 90%   BMI 29.65 kg/m   Wt Readings from Last 5 Encounters:  02/11/21 212 lb 9.6 oz (96.4 kg)  02/04/21 216 lb 4.3 oz (98.1 kg)  01/09/21 247 lb (112 kg)  04/11/18 235 lb (106.6 kg)  08/03/13 215 lb 6.4 oz (97.7 kg)    BMI Readings from Last 5 Encounters:  02/11/21 29.65 kg/m  02/04/21 31.03  kg/m  01/09/21 35.44 kg/m  04/11/18 33.72 kg/m  08/03/13 30.25 kg/m     Physical Exam General: Disheveled, sitting in the exam chair Eyes: EOMI, icterus Neck: Supple, no JVP Pulmonary: Distant, clear, no wheeze Cardiovascular: Regular rate and rhythm, no murmurs Abdomen: Nondistended, bowel sounds present MSK: No synovitis, no joint effusion Neuro: Normal gait, no weakness Skin: Ruddy erythema consistent with chronic venous changes Psych: Tangential, paranoid thoughts at times, normal mood, normal   Assessment & Plan:   Presumed pulmonary hypertension: Based on echocardiogram in setting of fluid volume overload.  High suspicion for contribution to group 2 disease given bilateral pleural effusions and dilated left atrium on TTE.  High suspicion for contribution of group 3 disease, chronically elevated bicarbonate and parenchymal changes given emphysema.  Possible OSA.  Lastly, possible contracted Group 1 disease as he has evidence of cirrhosis on CT scan.  Overall, he is a poor candidate for pulmonary vasodilators given his multi factorial contributions disease.  Furthermore, during the course of the visit he demonstrates some signs and symptoms of psychiatric disease, undiagnosed is on no medications or otherwise not well documented.  Seems to at times have paranoid thoughts, some delusions at times.  This further worsens his candidacy for pulmonary vasodilators.  We will repeat echocardiogram now has been well diuresed to reevaluate right-sided changes.  Labs today given he is on high-dose diuretics to evaluate for electrolytes and kidney function.  Repeat BNP.  Repeat hemoglobin A1c, notably hyperglycemic during recent hospitalization and hemoglobin A1c was 6.7, diagnostic of diabetes.  Will message PCP for further evaluation.  Tobacco abuse: Patient is precontemplative.  Counseled importance of abscess or cigarette smoking given damage to lungs to be emphysema, likely contribution to  heart issues as above.  Emphysema: Denies significant dyspnea.   Return in about 2 months (around 04/13/2021).   Lanier Clam, MD 02/11/2021   This appointment required 90 minutes of patient care (this includes precharting, chart review, review of results, face-to-face care, etc.).

## 2021-02-19 ENCOUNTER — Encounter (HOSPITAL_COMMUNITY): Payer: Self-pay | Admitting: Pulmonary Disease

## 2021-03-04 ENCOUNTER — Telehealth (HOSPITAL_COMMUNITY): Payer: Self-pay | Admitting: Pulmonary Disease

## 2021-03-04 NOTE — Telephone Encounter (Signed)
Just an FYI. We have made several attempts to contact this patient including sending a letter to schedule or reschedule their echocardiogram. We will be removing the patient from the echo [place].  02/19/21 MAILED LETTER [facility]  02/18/21 [facility] to schedule X 2 @ 10:15 [facility]  02/17/21 [facility] to schedule @ 12:03/[facility]       Thank you

## 2021-04-13 ENCOUNTER — Ambulatory Visit: Payer: Self-pay | Admitting: Pulmonary Disease

## 2021-09-03 DEATH — deceased

## 2022-04-19 IMAGING — CT CT ANGIO CHEST
2 of 6 series · 17 of 36 positions shown · IV contrast (omnipaque)
Comparison: Radiographs 01/27/2021 and 01/09/2021.  CT 04/10/2018.

CLINICAL DATA: Shortness of breath. Pulmonary embolism suspected,
high probability. History of diabetes, hypertension, pulmonary
arterial hypertension, congestive heart failure and COPD.

EXAM:
CT ANGIOGRAPHY CHEST WITH CONTRAST
TECHNIQUE: Multidetector CT imaging of the chest was performed using the
standard protocol during bolus administration of intravenous
contrast. Multiplanar CT image reconstructions and MIPs were
obtained to evaluate the vascular anatomy.
CONTRAST:  65mL OMNIPAQUE IOHEXOL 350 MG/ML SOLN

[Series 7: pe thins · axial · 0.92mm/px · z∈[+1140,+1415]mm · 16 of 436 slices shown]
[im 22/436  lung]
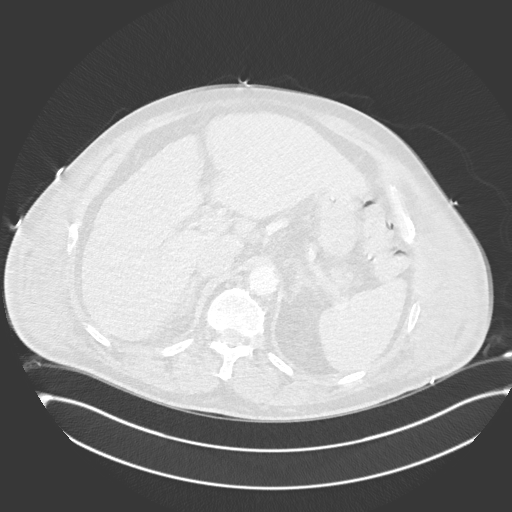
[im 44/436  mediastinal]
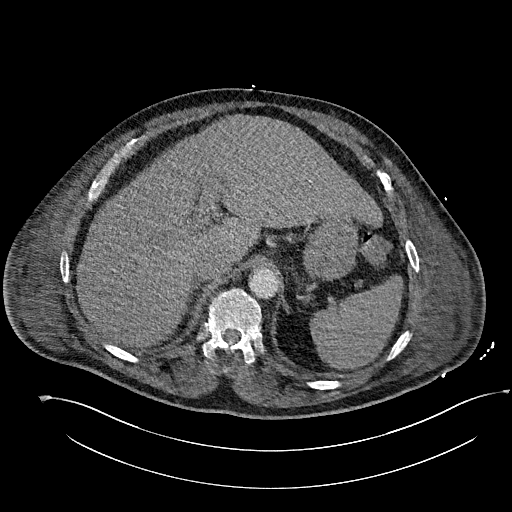
[im 66/436  lung]
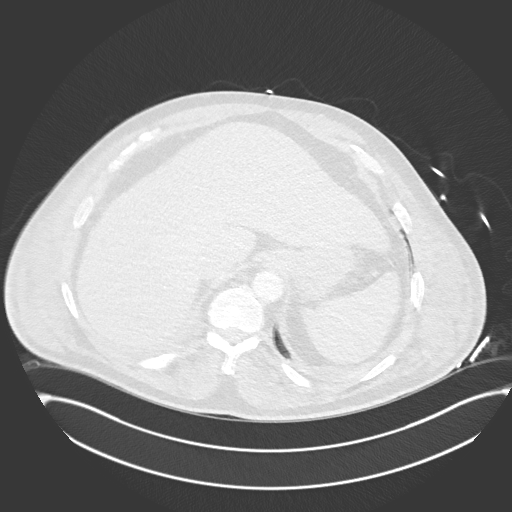
[im 109/436  mediastinal]
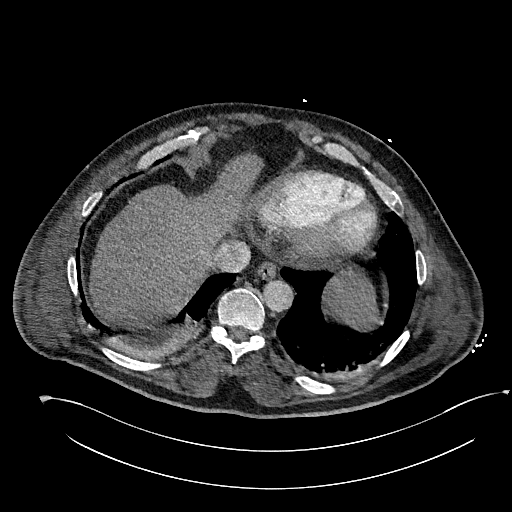
[im 131/436  lung]
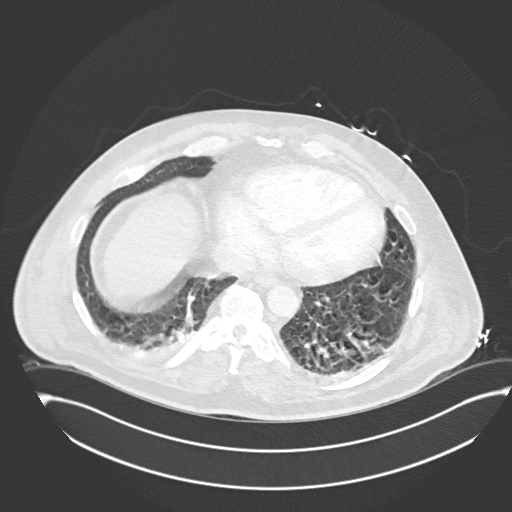
[im 153/436  mediastinal]
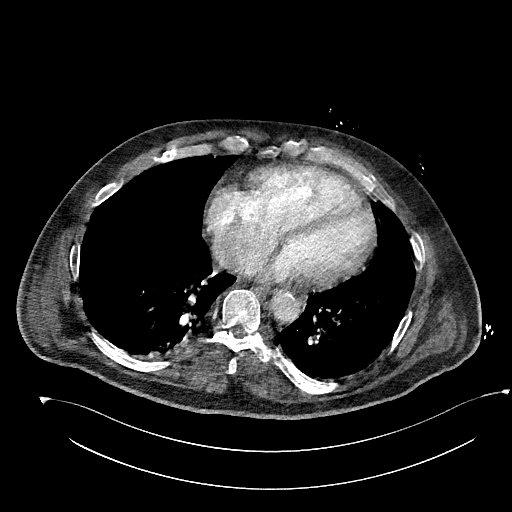
[im 175/436  lung]
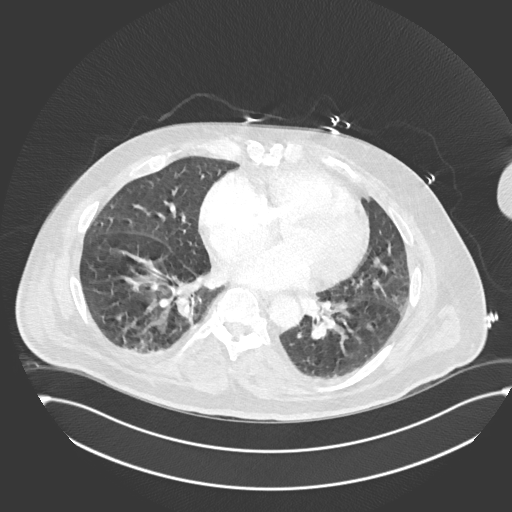
[im 196/436  mediastinal]
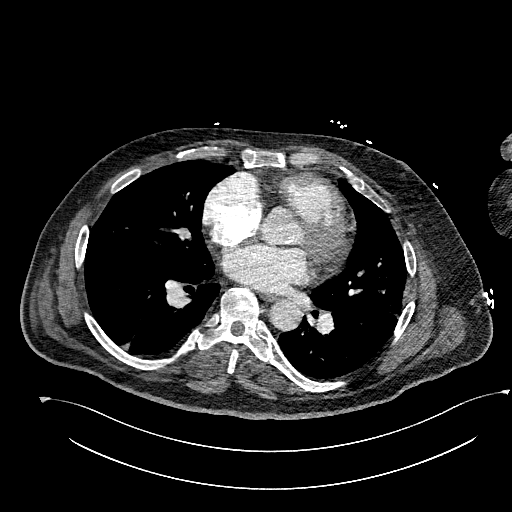
[im 240/436  lung]
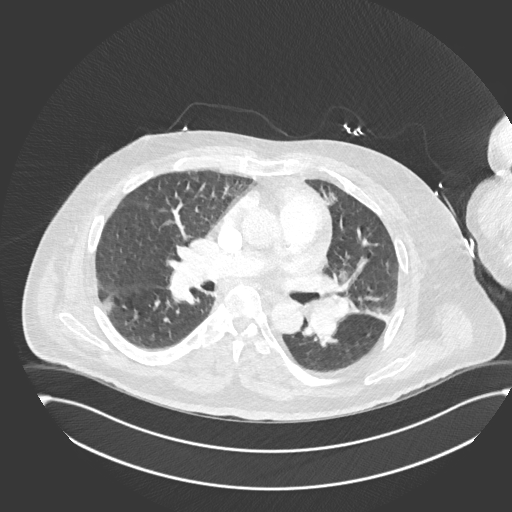
[im 262/436  mediastinal]
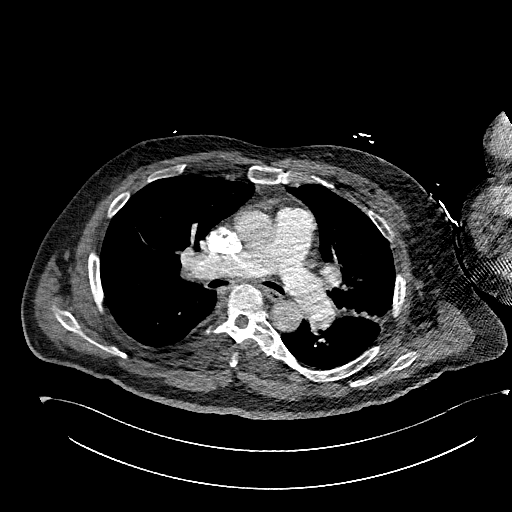
[im 283/436  lung]
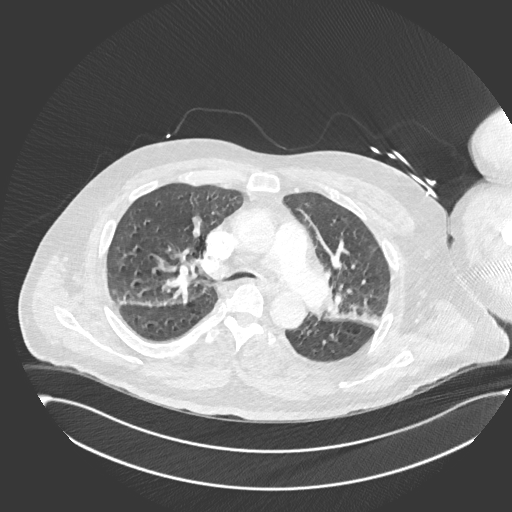
[im 305/436  mediastinal]
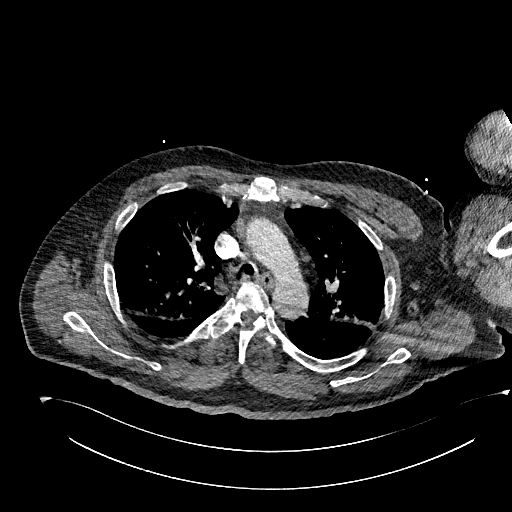
[im 327/436  lung]
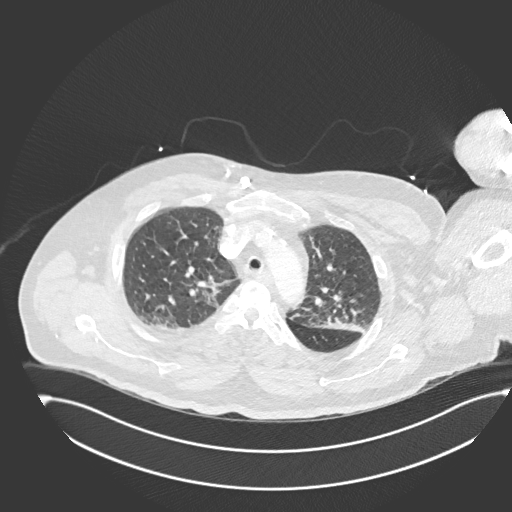
[im 370/436  mediastinal]
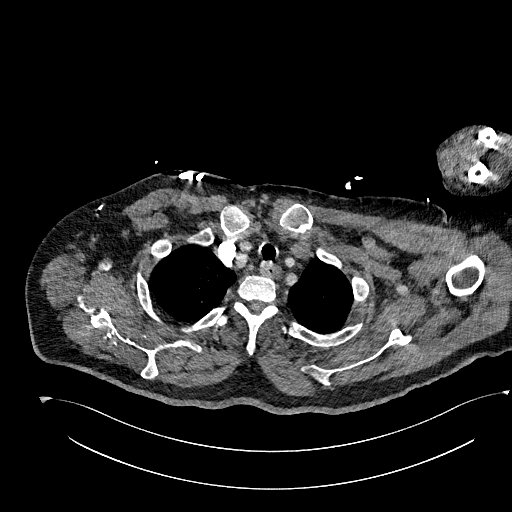
[im 392/436  lung]
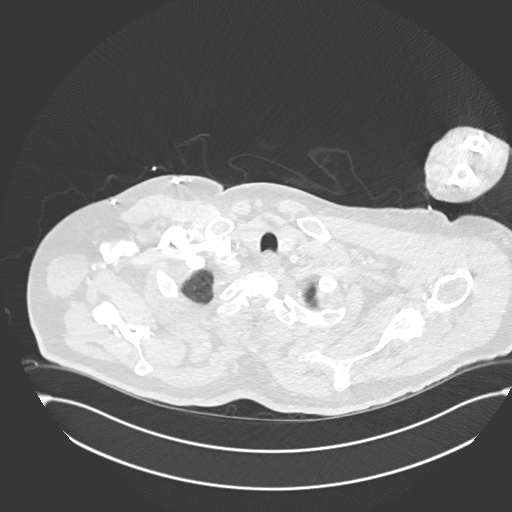
[im 414/436  mediastinal]
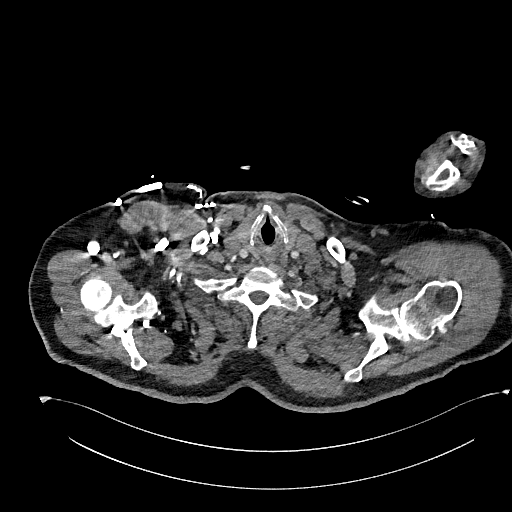

[Series 8: pe 2mm cor · coronal · 0.60mm/px · 1 of 155 slices shown]
[im 78/155  mediastinal]
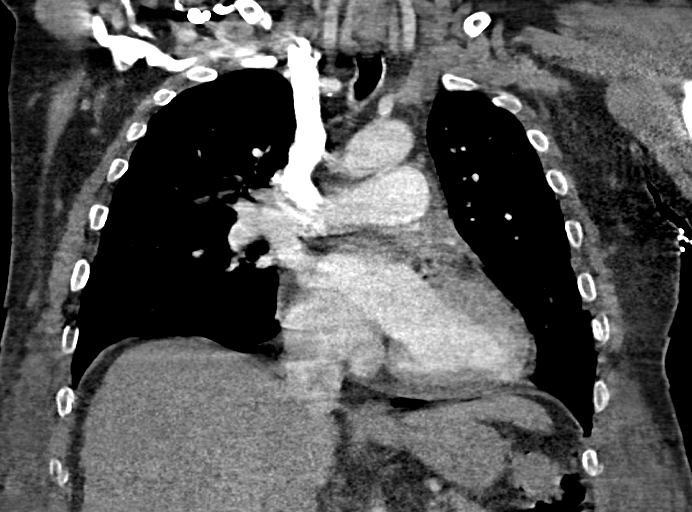

[17 of 36 positions shown; findings below may reference images not displayed]

FINDINGS: Despite efforts by the technologist and patient, mild motion
artifact is present on today's exam and could not be eliminated.
This reduces exam sensitivity and specificity.

Cardiovascular: The pulmonary arteries are suboptimally opacified
with contrast, and pulmonary arterial assessment is limited by
breathing artifact. No definite pulmonary emboli are seen. There is
atherosclerosis of the aorta, great vessels and coronary arteries.
There is mild central enlargement of the pulmonary arteries. The
heart size is normal. There is no pericardial effusion.

Mediastinum/Nodes: There are no enlarged mediastinal, hilar or
axillary lymph nodes.The previously identified mildly enlarged hilar
lymph nodes appear improved. The thyroid gland, trachea and
esophagus demonstrate no significant findings.

Lungs/Pleura: Trace bilateral pleural effusions with new
subsegmental atelectasis in both upper and lower lobes. There is
underlying mild centrilobular and paraseptal emphysema. No
consolidation or suspicious pulmonary nodule. No pneumothorax.

Upper abdomen: Hepatic low density consistent with steatosis. There
is also mild contour irregularity of the liver, suspicious for early
cirrhosis. No acute findings are evident.

Musculoskeletal/Chest wall: No chest wall mass or definite acute
osseous findings are seen within the chest. There is suspicion of a
burst fracture involving the L1 vertebral body, incompletely
visualized by this study, but appearing new compared with the prior
chest CT from 5252. This is associated with some osseous
retropulsion. Generalized flank edema noted.

Review of the MIP images confirms the above findings.
IMPRESSION: 1. No definite evidence of acute pulmonary embolism or other acute
vascular findings in the chest. Opacification of the pulmonary
arteries is suboptimal. If there is persistent high concern of
thromboembolic disease, consider further evaluation with lower
extremity venous Doppler ultrasound.
2. Suspected interval burst fracture at L1 compared with chest CT
04/10/2018. This is incompletely visualized by this examination,
although does not show definite acute features. Correlation with
lumbar spine radiographs recommended.
3. Dependent atelectasis in both lungs.
4. Hepatic steatosis and possible early changes of cirrhosis.
5.  Aortic Atherosclerosis ([id number].O).
